# Patient Record
Sex: Female | Born: 1980 | Race: Black or African American | Hispanic: No | Marital: Single | Smoking: Never smoker
Health system: Southern US, Community
[De-identification: ages and names within clinical notes are randomized; demographics above are authoritative.]

## PROBLEM LIST (undated history)

## (undated) HISTORY — PX: WISDOM TOOTH EXTRACTION: SHX21

## (undated) HISTORY — PX: OTHER SURGICAL HISTORY: SHX169

---

## 2013-03-10 ENCOUNTER — Encounter: Payer: Self-pay | Admitting: *Deleted

## 2013-04-07 ENCOUNTER — Encounter: Payer: Self-pay | Admitting: Obstetrics & Gynecology

## 2013-06-16 ENCOUNTER — Encounter: Payer: Self-pay | Admitting: Family Medicine

## 2013-06-16 ENCOUNTER — Ambulatory Visit (INDEPENDENT_AMBULATORY_CARE_PROVIDER_SITE_OTHER): Payer: Medicaid Other | Admitting: Family Medicine

## 2013-06-16 ENCOUNTER — Other Ambulatory Visit (HOSPITAL_COMMUNITY)
Admission: RE | Admit: 2013-06-16 | Discharge: 2013-06-16 | Disposition: A | Discharge status: Home / Self Care | Payer: Medicaid Other | Source: Ambulatory Visit | Attending: Family Medicine | Admitting: Family Medicine

## 2013-06-16 VITALS — BP 100/67 | Temp 97.1°F | Wt 211.7 lb

## 2013-06-16 DIAGNOSIS — Z3482 Encounter for supervision of other normal pregnancy, second trimester: Secondary | ICD-10-CM

## 2013-06-16 DIAGNOSIS — Z3480 Encounter for supervision of other normal pregnancy, unspecified trimester: Secondary | ICD-10-CM | POA: Insufficient documentation

## 2013-06-16 DIAGNOSIS — Z01419 Encounter for gynecological examination (general) (routine) without abnormal findings: Secondary | ICD-10-CM | POA: Insufficient documentation

## 2013-06-16 DIAGNOSIS — Z3490 Encounter for supervision of normal pregnancy, unspecified, unspecified trimester: Secondary | ICD-10-CM

## 2013-06-16 DIAGNOSIS — Z113 Encounter for screening for infections with a predominantly sexual mode of transmission: Secondary | ICD-10-CM | POA: Insufficient documentation

## 2013-06-16 DIAGNOSIS — Z1151 Encounter for screening for human papillomavirus (HPV): Secondary | ICD-10-CM | POA: Insufficient documentation

## 2013-06-16 LAB — POCT URINALYSIS DIP (DEVICE)
Glucose, UA: NEGATIVE mg/dL
Ketones, ur: NEGATIVE mg/dL
Protein, ur: NEGATIVE mg/dL
Specific Gravity, Urine: 1.02 (ref 1.005–1.030)
Urobilinogen, UA: 0.2 mg/dL (ref 0.0–1.0)

## 2013-06-16 NOTE — Patient Instructions (Signed)
Second Trimester of Pregnancy The second trimester is from week 13 through week 28, months 4 through 6. The second trimester is often a time when you feel your best. Your body has also adjusted to being pregnant, and you begin to feel better physically. Usually, morning sickness has lessened or quit completely, you may have more energy, and you may have an increase in appetite. The second trimester is also a time when the fetus is growing rapidly. At the end of the sixth month, the fetus is about 9 inches long and weighs about 1 pounds. You will likely begin to feel the baby move (quickening) between 18 and 20 weeks of the pregnancy. BODY CHANGES Your body goes through many changes during pregnancy. The changes vary from woman to woman.   Your weight will continue to increase. You will notice your lower abdomen bulging out.  You may begin to get stretch marks on your hips, abdomen, and breasts.  You may develop headaches that can be relieved by medicines approved by your caregiver.  You may urinate more often because the fetus is pressing on your bladder.  You may develop or continue to have heartburn as a result of your pregnancy.  You may develop constipation because certain hormones are causing the muscles that push waste through your intestines to slow down.  You may develop hemorrhoids or swollen, bulging veins (varicose veins).  You may have back pain because of the weight gain and pregnancy hormones relaxing your joints between the bones in your pelvis and as a result of a shift in weight and the muscles that support your balance.  Your breasts will continue to grow and be tender.  Your gums may bleed and may be sensitive to brushing and flossing.  Dark spots or blotches (chloasma, mask of pregnancy) may develop on your face. This will likely fade after the baby is born.  A dark line from your belly button to the pubic area (linea nigra) may appear. This will likely fade after the  baby is born. WHAT TO EXPECT AT YOUR PRENATAL VISITS During a routine prenatal visit:  You will be weighed to make sure you and the fetus are growing normally.  Your blood pressure will be taken.  Your abdomen will be measured to track your baby's growth.  The fetal heartbeat will be listened to.  Any test results from the previous visit will be discussed. Your caregiver may ask you:  How you are feeling.  If you are feeling the baby move.  If you have had any abnormal symptoms, such as leaking fluid, bleeding, severe headaches, or abdominal cramping.  If you have any questions. Other tests that may be performed during your second trimester include:  Blood tests that check for:  Low iron levels (anemia).  Gestational diabetes (between 24 and 28 weeks).  Rh antibodies.  Urine tests to check for infections, diabetes, or protein in the urine.  An ultrasound to confirm the proper growth and development of the baby.  An amniocentesis to check for possible genetic problems.  Fetal screens for spina bifida and Down syndrome. HOME CARE INSTRUCTIONS   Avoid all smoking, herbs, alcohol, and unprescribed drugs. These chemicals affect the formation and growth of the baby.  Follow your caregiver's instructions regarding medicine use. There are medicines that are either safe or unsafe to take during pregnancy.  Exercise only as directed by your caregiver. Experiencing uterine cramps is a good sign to stop exercising.  Continue to eat regular,   healthy meals.  Wear a good support bra for breast tenderness.  Do not use hot tubs, steam rooms, or saunas.  Wear your seat belt at all times when driving.  Avoid raw meat, uncooked cheese, cat litter boxes, and soil used by cats. These carry germs that can cause birth defects in the baby.  Take your prenatal vitamins.  Try taking a stool softener (if your caregiver approves) if you develop constipation. Eat more high-fiber foods,  such as fresh vegetables or fruit and whole grains. Drink plenty of fluids to keep your urine clear or pale yellow.  Take warm sitz baths to soothe any pain or discomfort caused by hemorrhoids. Use hemorrhoid cream if your caregiver approves.  If you develop varicose veins, wear support hose. Elevate your feet for 15 minutes, 3 4 times a day. Limit salt in your diet.  Avoid heavy lifting, wear low heel shoes, and practice good posture.  Rest with your legs elevated if you have leg cramps or low back pain.  Visit your dentist if you have not gone yet during your pregnancy. Use a soft toothbrush to brush your teeth and be gentle when you floss.  A sexual relationship may be continued unless your caregiver directs you otherwise.  Continue to go to all your prenatal visits as directed by your caregiver. SEEK MEDICAL CARE IF:   You have dizziness.  You have mild pelvic cramps, pelvic pressure, or nagging pain in the abdominal area.  You have persistent nausea, vomiting, or diarrhea.  You have a bad smelling vaginal discharge.  You have pain with urination. SEEK IMMEDIATE MEDICAL CARE IF:   You have a fever.  You are leaking fluid from your vagina.  You have spotting or bleeding from your vagina.  You have severe abdominal cramping or pain.  You have rapid weight gain or loss.  You have shortness of breath with chest pain.  You notice sudden or extreme swelling of your face, hands, ankles, feet, or legs.  You have not felt your baby move in over an hour.  You have severe headaches that do not go away with medicine.  You have vision changes. Document Released: 07/10/2001 Document Revised: 03/18/2013 Document Reviewed: 09/16/2012 [employer] Patient Information 2014 [employer].  

## 2013-06-16 NOTE — Addendum Note (Signed)
Addended by: Candelaria Stagers E on: 06/16/2013 04:55 PM   Modules accepted: Orders

## 2013-06-16 NOTE — Progress Notes (Signed)
Subjective:    Misty Cunningham is being seen today for her first obstetrical visit. Was referred from the general medical clinic for a positive pregnancy test on 02/27/13. Sure LMP of 01/10/13.   This is a planned pregnancy. She is at [redacted]w[redacted]d gestation. Her obstetrical history is significant for no risk factors. Relationship with FOB: significant other, living together. Patient does intend to breast feed. Pregnancy history fully reviewed.  Having lots of FM. No ctx, lof, vb. No fevers, chills, nausea, vomiting.   Menstrual History: OB History   Grav Para Term Preterm Abortions TAB SAB Ect Mult Living   3 2 2       2     G1- girl, NSVD, 7lb 8oz G2- boy, NSVD, induced for postdates, 8lb4oz  G3- current    Patient's last menstrual period was 01/10/2013.   History reviewed. No pertinent past medical history.  History reviewed. No pertinent family history.  History   Social History  . Marital Status: Married    Spouse Name: N/A    Number of Children: N/A  . Years of Education: N/A   Occupational History  . Not on file.   Social History Main Topics  . Smoking status: Never Smoker   . Smokeless tobacco: Not on file  . Alcohol Use: No  . Drug Use: No  . Sexual Activity: Yes    Birth Control/ Protection: None   Other Topics Concern  . Not on file   Social History Narrative  . No narrative on file    No current outpatient prescriptions on file prior to visit.   No current facility-administered medications on file prior to visit.   Review of Systems Pertinent items are noted in HPI.    Objective:     BP 100/67  Temp(Src) 97.1 F (36.2 C)  Wt 211 lb 11.2 oz (96.026 kg)  LMP 01/10/2013 GENERAL: Well-developed, well-nourished female in no acute distress.  HEENT: Normocephalic, atraumatic. Sclerae anicteric.  NECK: Supple. Normal thyroid.  LUNGS: Clear to auscultation bilaterally.  HEART: Regular rate and rhythm. BREASTS: Symmetric in size- large and pendulous. No masses,  skin changes, nipple drainage, or lymphadenopathy. ABDOMEN: Soft, nontender, nondistended.  PELVIC: Normal external female genitalia. Vagina is pink and rugated.  Normal discharge. Normal cervix contour. Pap smear obtained. Uterus  Size=dates. No adnexal mass or tenderness.  EXTREMITIES: No cyanosis, clubbing, or edema, 2+ distal pulses.       Assessment:    Pregnancy at 22 and 3/7 weeks    Plan:    Initial labs drawn. Prenatal vitamins ordered Problem list reviewed and updated. Pt prefers midwife only providers both in clinic and at delivery   Role of ultrasound in pregnancy discussed; fetal survey: requested.  Follow up in 4 weeks. 50% of 30 min visit spent on counseling and coordination of care.

## 2013-06-16 NOTE — Progress Notes (Signed)
P-75 

## 2013-06-17 LAB — OBSTETRIC PANEL
Basophils Absolute: 0 10*3/uL (ref 0.0–0.1)
Hepatitis B Surface Ag: NEGATIVE
Lymphocytes Relative: 15 % (ref 12–46)
Lymphs Abs: 1.3 10*3/uL (ref 0.7–4.0)
MCV: 85.6 fL (ref 78.0–100.0)
Neutro Abs: 7 10*3/uL (ref 1.7–7.7)
Platelets: 219 10*3/uL (ref 150–400)
RBC: 3.47 MIL/uL — ABNORMAL LOW (ref 3.87–5.11)
RDW: 14.1 % (ref 11.5–15.5)
Rubella: 23.9 Index — ABNORMAL HIGH (ref <0.90)
WBC: 9.1 10*3/uL (ref 4.0–10.5)

## 2013-06-17 LAB — PRESCRIPTION MONITORING PROFILE (19 PANEL)
Barbiturate Screen, Urine: NEGATIVE ng/mL
Benzodiazepine Screen, Urine: NEGATIVE ng/mL
Buprenorphine, Urine: NEGATIVE ng/mL
Cannabinoid Scrn, Ur: NEGATIVE ng/mL
Cocaine Metabolites: NEGATIVE ng/mL
Fentanyl, Ur: NEGATIVE ng/mL
Methaqualone: NEGATIVE ng/mL
Opiate Screen, Urine: NEGATIVE ng/mL
Tramadol Scrn, Ur: NEGATIVE ng/mL
Zolpidem, Urine: NEGATIVE ng/mL
pH, Initial: 5.8 pH (ref 4.5–8.9)

## 2013-06-17 LAB — ALCOHOL METABOLITE (ETG), URINE: Ethyl Glucuronide (EtG): NEGATIVE ng/mL

## 2013-06-18 LAB — CULTURE, OB URINE: Colony Count: 35000

## 2013-06-19 ENCOUNTER — Encounter: Payer: Self-pay | Admitting: Family Medicine

## 2013-06-19 DIAGNOSIS — D573 Sickle-cell trait: Secondary | ICD-10-CM | POA: Insufficient documentation

## 2013-06-19 LAB — HEMOGLOBINOPATHY EVALUATION
Hemoglobin Other: 0 %
Hgb F Quant: 0 % (ref 0.0–2.0)
Hgb S Quant: 38.6 % — ABNORMAL HIGH

## 2013-06-22 ENCOUNTER — Telehealth: Payer: Self-pay | Admitting: *Deleted

## 2013-06-22 NOTE — Telephone Encounter (Signed)
Message copied by Gerome Apley on Mon Jun 22, 2013  9:39 AM ------      Message from: [place]      Created: Fri Jun 19, 2013  9:59 PM       The pt probably already knows this, but can you please call her and let her know that her lab work shows that she has sickle cell trait? Thanks! ------

## 2013-06-22 NOTE — Telephone Encounter (Signed)
Called [facility] and left a message we are calling with some results. Please call the office during office hours.

## 2013-06-23 ENCOUNTER — Ambulatory Visit (HOSPITAL_COMMUNITY): Admission: RE | Admit: 2013-06-23 | Payer: Medicaid Other | Source: Ambulatory Visit

## 2013-06-23 NOTE — Telephone Encounter (Signed)
Called patient and left message to call us back.

## 2013-06-24 ENCOUNTER — Other Ambulatory Visit: Payer: Self-pay | Admitting: Family Medicine

## 2013-06-24 DIAGNOSIS — Z09 Encounter for follow-up examination after completed treatment for conditions other than malignant neoplasm: Secondary | ICD-10-CM

## 2013-06-24 NOTE — Telephone Encounter (Signed)
Called Misty Cunningham and notified her of sickle cell trait- she states she did not know she had sickle cell trait , but that she knew her son did.  Advised her at next ob appt the provider could answer any questions she has, reviewed date of next appt. With her.

## 2013-07-02 ENCOUNTER — Encounter: Payer: Self-pay | Admitting: Obstetrics & Gynecology

## 2013-07-07 ENCOUNTER — Ambulatory Visit (HOSPITAL_COMMUNITY)
Admission: RE | Admit: 2013-07-07 | Discharge: 2013-07-07 | Disposition: A | Discharge status: Home / Self Care | Payer: Medicaid Other | Source: Ambulatory Visit | Attending: Family Medicine | Admitting: Family Medicine

## 2013-07-07 DIAGNOSIS — Z09 Encounter for follow-up examination after completed treatment for conditions other than malignant neoplasm: Secondary | ICD-10-CM

## 2013-07-07 DIAGNOSIS — Z3689 Encounter for other specified antenatal screening: Secondary | ICD-10-CM | POA: Insufficient documentation

## 2013-07-08 ENCOUNTER — Encounter: Payer: Self-pay | Admitting: Family Medicine

## 2013-07-15 ENCOUNTER — Encounter: Payer: Self-pay | Admitting: Advanced Practice Midwife

## 2013-07-15 ENCOUNTER — Ambulatory Visit (INDEPENDENT_AMBULATORY_CARE_PROVIDER_SITE_OTHER): Payer: Medicaid Other | Admitting: Advanced Practice Midwife

## 2013-07-15 VITALS — BP 113/73 | Wt 217.0 lb

## 2013-07-15 DIAGNOSIS — Z3480 Encounter for supervision of other normal pregnancy, unspecified trimester: Secondary | ICD-10-CM

## 2013-07-15 DIAGNOSIS — D573 Sickle-cell trait: Secondary | ICD-10-CM

## 2013-07-15 LAB — POCT URINALYSIS DIP (DEVICE)
Bilirubin Urine: NEGATIVE
Ketones, ur: NEGATIVE mg/dL
Nitrite: NEGATIVE
pH: 6.5 (ref 5.0–8.0)

## 2013-07-15 NOTE — Progress Notes (Signed)
Pulse: 89 Pt will do her 28 week labs at next visit.

## 2013-07-15 NOTE — Progress Notes (Signed)
Doing well.  Good fetal movement, denies vaginal bleeding, LOF, regular contractions.   Some occasional cramping while at work, bilateral sharp pains, pelvic pain with movement.  Discussed round ligament and pelvic pain, recommend rest, ice, heat, increased fluid intake.  Was taking vegetarian multivitamin, 7 pills/day, but causes nausea.  Recommend prenatal vitamin, or at least iron supplement daily.  Labs/glucose testing in 2 weeks at next visit.

## 2013-07-30 NOTE — L&D Delivery Note (Signed)
Delivery Note At 9:43 PM a viable female was delivered via Vaginal, Spontaneous Delivery (Presentation:occiput; anterior ).  APGAR: 9, 9; weight pending.   Placenta status: Intact, Spontaneous.  Cord: 3 vessels with the following complications: None.  Cord pH: not sent. IM pitocin (10 units) administered after delivery and fundus massaged until firm and hemodynamically stable.  Grandmother, FOB, and 2 children present for delivery. Infant put directly on mom's chest after delivery.  Parents bonding with infant.   Anesthesia: None  Episiotomy: None Lacerations: None Suture Repair: N/A Est. Blood Loss (mL): 400  Mom to postpartum.  Baby to Couplet care / Skin to Skin.  Misty Cunningham, Misty Cunningham 10/23/2013, 10:17 PM  I have seen and examined this patient and I agree with the above. Misty Cunningham 10:30 PM 10/23/2013

## 2013-08-12 ENCOUNTER — Ambulatory Visit (INDEPENDENT_AMBULATORY_CARE_PROVIDER_SITE_OTHER): Payer: Medicaid Other | Admitting: Family

## 2013-08-12 VITALS — BP 110/65 | Wt 220.8 lb

## 2013-08-12 DIAGNOSIS — D573 Sickle-cell trait: Secondary | ICD-10-CM

## 2013-08-12 DIAGNOSIS — Z3480 Encounter for supervision of other normal pregnancy, unspecified trimester: Secondary | ICD-10-CM

## 2013-08-12 LAB — POCT URINALYSIS DIP (DEVICE)
Bilirubin Urine: NEGATIVE
Glucose, UA: NEGATIVE mg/dL
Hgb urine dipstick: NEGATIVE
Ketones, ur: NEGATIVE mg/dL
Nitrite: NEGATIVE
Protein, ur: NEGATIVE mg/dL
Specific Gravity, Urine: 1.015 (ref 1.005–1.030)
Urobilinogen, UA: 0.2 mg/dL (ref 0.0–1.0)
pH: 7 (ref 5.0–8.0)

## 2013-08-12 NOTE — Progress Notes (Signed)
Pulse: 92

## 2013-08-12 NOTE — Progress Notes (Signed)
Reviewed ultrasound results (bilat renal pyelectasis)> rescan at 34 wks.  Report pressure in lower back, no UTI symptoms > urine culture sent.  Denies vaginal bleeding or leaking of fluid.  Preterm labor precautions given.  Unable to stay for 1 hr today.  Will return next week for lab only.

## 2013-08-14 LAB — CULTURE, OB URINE
COLONY COUNT: NO GROWTH
ORGANISM ID, BACTERIA: NO GROWTH

## 2013-08-18 ENCOUNTER — Other Ambulatory Visit: Payer: Medicaid Other

## 2013-08-18 DIAGNOSIS — Z3480 Encounter for supervision of other normal pregnancy, unspecified trimester: Secondary | ICD-10-CM

## 2013-08-18 LAB — RPR

## 2013-08-18 LAB — HIV ANTIBODY (ROUTINE TESTING W REFLEX): HIV: NONREACTIVE

## 2013-08-19 ENCOUNTER — Encounter: Payer: Self-pay | Admitting: Family

## 2013-08-19 LAB — GLUCOSE TOLERANCE, 1 HOUR (50G) W/O FASTING: Glucose, 1 Hour GTT: 140 mg/dL (ref 70–140)

## 2013-08-26 ENCOUNTER — Encounter: Payer: Medicaid Other | Admitting: Advanced Practice Midwife

## 2013-09-09 ENCOUNTER — Encounter: Payer: Self-pay | Admitting: Obstetrics and Gynecology

## 2013-09-09 ENCOUNTER — Ambulatory Visit (INDEPENDENT_AMBULATORY_CARE_PROVIDER_SITE_OTHER): Payer: Medicaid Other | Admitting: Advanced Practice Midwife

## 2013-09-09 ENCOUNTER — Encounter: Payer: Self-pay | Admitting: Advanced Practice Midwife

## 2013-09-09 VITALS — BP 113/76 | Temp 97.2°F | Wt 230.2 lb

## 2013-09-09 DIAGNOSIS — O283 Abnormal ultrasonic finding on antenatal screening of mother: Secondary | ICD-10-CM

## 2013-09-09 DIAGNOSIS — Z3480 Encounter for supervision of other normal pregnancy, unspecified trimester: Secondary | ICD-10-CM

## 2013-09-09 DIAGNOSIS — D573 Sickle-cell trait: Secondary | ICD-10-CM

## 2013-09-09 LAB — POCT URINALYSIS DIP (DEVICE)
Bilirubin Urine: NEGATIVE
Glucose, UA: NEGATIVE mg/dL
Ketones, ur: NEGATIVE mg/dL
NITRITE: NEGATIVE
PH: 6 (ref 5.0–8.0)
Protein, ur: NEGATIVE mg/dL
Specific Gravity, Urine: 1.015 (ref 1.005–1.030)
UROBILINOGEN UA: 0.2 mg/dL (ref 0.0–1.0)

## 2013-09-09 NOTE — Progress Notes (Signed)
Reviewed ultrasound results and gave further description of bilat renal pyelectasis.  Follow-up ultrasound scheduled. Reports occasional contractions. Denies VB, LOF. Reviewed labor precautions.  Vaginal exam showed no bleeding at this time. Not other complications.

## 2013-09-09 NOTE — Progress Notes (Signed)
Seen also by me.  States has had pink spotting all week. +/- contractions. Cervix softened. PTL precautions reviewed. States was never told about "liver abnormality" until recently. I reviewed chart, and B renal pyelectasis was discussed on 08/12/13 by CNM and I further explained in detail what it was and that it was considered mild. Also that in isolation, not thought to be a big concern, though we will do one more US to follow up.

## 2013-09-09 NOTE — Progress Notes (Signed)
Pulse- 89  Patient reports pelvic pain/pressure; has occasional non painful contractions; patient reports spotting off and on for past week; patient needs to schedule follow up ultrasound appt.

## 2013-09-09 NOTE — Patient Instructions (Signed)
Third Trimester of Pregnancy  The third trimester is from week 29 through week 42, months 7 through 9. The third trimester is a time when the fetus is growing rapidly. At the end of the ninth month, the fetus is about 20 inches in length and weighs 6 10 pounds.   BODY CHANGES  Your body goes through many changes during pregnancy. The changes vary from woman to woman.    Your weight will continue to increase. You can expect to gain 25 35 pounds (11 16 kg) by the end of the pregnancy.   You may begin to get stretch marks on your hips, abdomen, and breasts.   You may urinate more often because the fetus is moving lower into your pelvis and pressing on your bladder.   You may develop or continue to have heartburn as a result of your pregnancy.   You may develop constipation because certain hormones are causing the muscles that push waste through your intestines to slow down.   You may develop hemorrhoids or swollen, bulging veins (varicose veins).   You may have pelvic pain because of the weight gain and pregnancy hormones relaxing your joints between the bones in your pelvis. Back aches may result from over exertion of the muscles supporting your posture.   Your breasts will continue to grow and be tender. A yellow discharge may leak from your breasts called colostrum.   Your belly button may stick out.   You may feel short of breath because of your expanding uterus.   You may notice the fetus "dropping," or moving lower in your abdomen.   You may have a bloody mucus discharge. This usually occurs a few days to a week before labor begins.   Your cervix becomes thin and soft (effaced) near your due date.  WHAT TO EXPECT AT YOUR PRENATAL EXAMS   You will have prenatal exams every 2 weeks until week 36. Then, you will have weekly prenatal exams. During a routine prenatal visit:   You will be weighed to make sure you and the fetus are growing normally.   Your blood pressure is taken.   Your abdomen will be  measured to track your baby's growth.   The fetal heartbeat will be listened to.   Any test results from the previous visit will be discussed.   You may have a cervical check near your due date to see if you have effaced.  At around 36 weeks, your caregiver will check your cervix. At the same time, your caregiver will also perform a test on the secretions of the vaginal tissue. This test is to determine if a type of bacteria, Group B streptococcus, is present. Your caregiver will explain this further.  Your caregiver may ask you:   What your birth plan is.   How you are feeling.   If you are feeling the baby move.   If you have had any abnormal symptoms, such as leaking fluid, bleeding, severe headaches, or abdominal cramping.   If you have any questions.  Other tests or screenings that may be performed during your third trimester include:   Blood tests that check for low iron levels (anemia).   Fetal testing to check the health, activity level, and growth of the fetus. Testing is done if you have certain medical conditions or if there are problems during the pregnancy.  FALSE LABOR  You may feel small, irregular contractions that eventually go away. These are called Braxton Hicks contractions, or   false labor. Contractions may last for hours, days, or even weeks before true labor sets in. If contractions come at regular intervals, intensify, or become painful, it is best to be seen by your caregiver.   SIGNS OF LABOR    Menstrual-like cramps.   Contractions that are 5 minutes apart or less.   Contractions that start on the top of the uterus and spread down to the lower abdomen and back.   A sense of increased pelvic pressure or back pain.   A watery or bloody mucus discharge that comes from the vagina.  If you have any of these signs before the 37th week of pregnancy, call your caregiver right away. You need to go to the hospital to get checked immediately.  HOME CARE INSTRUCTIONS    Avoid all  smoking, herbs, alcohol, and unprescribed drugs. These chemicals affect the formation and growth of the baby.   Follow your caregiver's instructions regarding medicine use. There are medicines that are either safe or unsafe to take during pregnancy.   Exercise only as directed by your caregiver. Experiencing uterine cramps is a good sign to stop exercising.   Continue to eat regular, healthy meals.   Wear a good support bra for breast tenderness.   Do not use hot tubs, steam rooms, or saunas.   Wear your seat belt at all times when driving.   Avoid raw meat, uncooked cheese, cat litter boxes, and soil used by cats. These carry germs that can cause birth defects in the baby.   Take your prenatal vitamins.   Try taking a stool softener (if your caregiver approves) if you develop constipation. Eat more high-fiber foods, such as fresh vegetables or fruit and whole grains. Drink plenty of fluids to keep your urine clear or pale yellow.   Take warm sitz baths to soothe any pain or discomfort caused by hemorrhoids. Use hemorrhoid cream if your caregiver approves.   If you develop varicose veins, wear support hose. Elevate your feet for 15 minutes, 3 4 times a day. Limit salt in your diet.   Avoid heavy lifting, wear low heal shoes, and practice good posture.   Rest a lot with your legs elevated if you have leg cramps or low back pain.   Visit your dentist if you have not gone during your pregnancy. Use a soft toothbrush to brush your teeth and be gentle when you floss.   A sexual relationship may be continued unless your caregiver directs you otherwise.   Do not travel far distances unless it is absolutely necessary and only with the approval of your caregiver.   Take prenatal classes to understand, practice, and ask questions about the labor and delivery.   Make a trial run to the hospital.   Pack your hospital bag.   Prepare the baby's nursery.   Continue to go to all your prenatal visits as directed  by your caregiver.  SEEK MEDICAL CARE IF:   You are unsure if you are in labor or if your water has broken.   You have dizziness.   You have mild pelvic cramps, pelvic pressure, or nagging pain in your abdominal area.   You have persistent nausea, vomiting, or diarrhea.   You have a bad smelling vaginal discharge.   You have pain with urination.  SEEK IMMEDIATE MEDICAL CARE IF:    You have a fever.   You are leaking fluid from your vagina.   You have spotting or bleeding from your vagina.     You have severe abdominal cramping or pain.   You have rapid weight loss or gain.   You have shortness of breath with chest pain.   You notice sudden or extreme swelling of your face, hands, ankles, feet, or legs.   You have not felt your baby move in over an hour.   You have severe headaches that do not go away with medicine.   You have vision changes.  Document Released: 07/10/2001 Document Revised: 03/18/2013 Document Reviewed: 09/16/2012  [employer] Patient Information 2014 [employer].

## 2013-09-15 ENCOUNTER — Ambulatory Visit (HOSPITAL_COMMUNITY): Admission: RE | Admit: 2013-09-15 | Payer: Medicaid Other | Source: Ambulatory Visit

## 2013-09-16 ENCOUNTER — Ambulatory Visit (HOSPITAL_COMMUNITY)
Admission: RE | Admit: 2013-09-16 | Discharge: 2013-09-16 | Disposition: A | Discharge status: Home / Self Care | Payer: Medicaid Other | Source: Ambulatory Visit | Attending: Advanced Practice Midwife | Admitting: Advanced Practice Midwife

## 2013-09-16 DIAGNOSIS — O358XX0 Maternal care for other (suspected) fetal abnormality and damage, not applicable or unspecified: Secondary | ICD-10-CM | POA: Insufficient documentation

## 2013-09-16 DIAGNOSIS — O283 Abnormal ultrasonic finding on antenatal screening of mother: Secondary | ICD-10-CM

## 2013-09-16 DIAGNOSIS — Z363 Encounter for antenatal screening for malformations: Secondary | ICD-10-CM | POA: Insufficient documentation

## 2013-09-16 DIAGNOSIS — Z1389 Encounter for screening for other disorder: Secondary | ICD-10-CM | POA: Insufficient documentation

## 2013-09-20 ENCOUNTER — Encounter (HOSPITAL_COMMUNITY): Payer: Self-pay | Admitting: Advanced Practice Midwife

## 2013-09-23 ENCOUNTER — Encounter: Payer: Medicaid Other | Admitting: Obstetrics and Gynecology

## 2013-10-07 ENCOUNTER — Encounter: Payer: Medicaid Other | Admitting: Obstetrics and Gynecology

## 2013-10-07 ENCOUNTER — Telehealth: Payer: Self-pay | Admitting: *Deleted

## 2013-10-07 NOTE — Telephone Encounter (Signed)
Patient called and stated that she had to cancel her appt for today. She was rescheduled for 10/21/13. She will be 5049w2d on the 25th. She wanted to know if she is going to be missing anything because of this. The last time she was seen she was 5919w4d. Called patient and left message for her to call back. Need to get her back in sooner than the 25th as she needs cultures.

## 2013-10-08 ENCOUNTER — Encounter: Payer: Self-pay | Admitting: Advanced Practice Midwife

## 2013-10-08 ENCOUNTER — Encounter: Payer: Medicaid Other | Admitting: Advanced Practice Midwife

## 2013-10-08 NOTE — Telephone Encounter (Signed)
Called patient back and informed her that I found an open slot for her for today at 1:40. Pt agreeable to this and will come to clinic.

## 2013-10-11 ENCOUNTER — Encounter: Payer: Self-pay | Admitting: *Deleted

## 2013-10-21 ENCOUNTER — Ambulatory Visit (INDEPENDENT_AMBULATORY_CARE_PROVIDER_SITE_OTHER): Payer: Medicaid Other | Admitting: Family Medicine

## 2013-10-21 VITALS — BP 128/85 | Temp 97.7°F | Wt 239.7 lb

## 2013-10-21 DIAGNOSIS — Z3480 Encounter for supervision of other normal pregnancy, unspecified trimester: Secondary | ICD-10-CM

## 2013-10-21 DIAGNOSIS — O480 Post-term pregnancy: Secondary | ICD-10-CM

## 2013-10-21 LAB — US OB FOLLOW UP

## 2013-10-21 LAB — POCT URINALYSIS DIP (DEVICE)
Bilirubin Urine: NEGATIVE
Glucose, UA: NEGATIVE mg/dL
HGB URINE DIPSTICK: NEGATIVE
Ketones, ur: NEGATIVE mg/dL
NITRITE: NEGATIVE
PH: 5.5 (ref 5.0–8.0)
PROTEIN: 30 mg/dL — AB
Specific Gravity, Urine: 1.02 (ref 1.005–1.030)
Urobilinogen, UA: 0.2 mg/dL (ref 0.0–1.0)

## 2013-10-21 LAB — OB RESULTS CONSOLE GBS: STREP GROUP B AG: NEGATIVE

## 2013-10-21 LAB — OB RESULTS CONSOLE GC/CHLAMYDIA
CHLAMYDIA, DNA PROBE: NEGATIVE
Gonorrhea: NEGATIVE

## 2013-10-21 NOTE — Progress Notes (Signed)
Pt declines IOL @ 41 wks- will continue twice weekly fetal testing.

## 2013-10-21 NOTE — Addendum Note (Signed)
Addended by: Faythe CasaBELLAMY, Langley Ingalls M on: 10/21/2013 02:47 PM   Modules accepted: Orders

## 2013-10-21 NOTE — Progress Notes (Signed)
P= 94 C/o of intermittent lower abdominal/pelvic pressure and braxton hicks.  

## 2013-10-21 NOTE — Progress Notes (Signed)
S: 33 yo G3P2002 @ 311w4d here for ROBV - NST and AFI done today due to postdates.   A/P - NST- cat I tracing. Baseline 145, mod var, + accels, no decels - one ctx. - labor precautions discussed - pt still refusing induction at 41 weeks. Discussed exponential increase in risk of still birth after 41 weeks and pt understands risk  - f/u for testing on Friday and for visit next week.

## 2013-10-22 LAB — GC/CHLAMYDIA PROBE AMP
CT Probe RNA: NEGATIVE
GC PROBE AMP APTIMA: NEGATIVE

## 2013-10-23 ENCOUNTER — Inpatient Hospital Stay (HOSPITAL_COMMUNITY)
Admission: AD | Admit: 2013-10-23 | Discharge: 2013-10-25 | DRG: 775 | Disposition: A | Discharge status: Home / Self Care | Payer: Medicaid Other | Source: Ambulatory Visit | Attending: Obstetrics & Gynecology | Admitting: Obstetrics & Gynecology

## 2013-10-23 ENCOUNTER — Other Ambulatory Visit: Payer: Medicaid Other

## 2013-10-23 ENCOUNTER — Encounter (HOSPITAL_COMMUNITY): Payer: Self-pay | Admitting: *Deleted

## 2013-10-23 ENCOUNTER — Encounter: Payer: Self-pay | Admitting: Family Medicine

## 2013-10-23 ENCOUNTER — Inpatient Hospital Stay (HOSPITAL_COMMUNITY)
Admission: AD | Admit: 2013-10-23 | Discharge: 2013-10-23 | Disposition: A | Discharge status: Home / Self Care | Payer: Medicaid Other | Source: Ambulatory Visit | Attending: Obstetrics & Gynecology | Admitting: Obstetrics & Gynecology

## 2013-10-23 DIAGNOSIS — R0602 Shortness of breath: Secondary | ICD-10-CM | POA: Diagnosis not present

## 2013-10-23 DIAGNOSIS — R0789 Other chest pain: Secondary | ICD-10-CM

## 2013-10-23 DIAGNOSIS — O99893 Other specified diseases and conditions complicating puerperium: Secondary | ICD-10-CM

## 2013-10-23 DIAGNOSIS — O9989 Other specified diseases and conditions complicating pregnancy, childbirth and the puerperium: Principal | ICD-10-CM

## 2013-10-23 DIAGNOSIS — IMO0001 Reserved for inherently not codable concepts without codable children: Secondary | ICD-10-CM

## 2013-10-23 DIAGNOSIS — O479 False labor, unspecified: Secondary | ICD-10-CM

## 2013-10-23 LAB — CBC
HCT: 28 % — ABNORMAL LOW (ref 36.0–46.0)
Hemoglobin: 9.7 g/dL — ABNORMAL LOW (ref 12.0–15.0)
MCH: 28.1 pg (ref 26.0–34.0)
MCHC: 34.6 g/dL (ref 30.0–36.0)
MCV: 81.2 fL (ref 78.0–100.0)
Platelets: 175 10*3/uL (ref 150–400)
RBC: 3.45 MIL/uL — ABNORMAL LOW (ref 3.87–5.11)
RDW: 15.7 % — AB (ref 11.5–15.5)
WBC: 11.4 10*3/uL — AB (ref 4.0–10.5)

## 2013-10-23 LAB — CULTURE, STREPTOCOCCUS GRP B W/SUSCEPT

## 2013-10-23 LAB — GLUCOSE, CAPILLARY: GLUCOSE-CAPILLARY: 106 mg/dL — AB (ref 70–99)

## 2013-10-23 MED ORDER — MISOPROSTOL 200 MCG PO TABS
ORAL_TABLET | ORAL | Status: AC
  Filled 2013-10-23: qty 5

## 2013-10-23 MED ORDER — LIDOCAINE HCL (PF) 1 % IJ SOLN
30.0000 mL | INTRAMUSCULAR | Status: DC | PRN
  Filled 2013-10-23: qty 30

## 2013-10-23 MED ORDER — OXYTOCIN 10 UNIT/ML IJ SOLN
10.0000 [IU] | Freq: Once | INTRAMUSCULAR | Status: AC
  Administered 2013-10-23: 10 [IU] via INTRAMUSCULAR

## 2013-10-23 MED ORDER — OXYCODONE-ACETAMINOPHEN 5-325 MG PO TABS: 1.0000 | ORAL_TABLET | ORAL | Status: DC | PRN

## 2013-10-23 MED ORDER — ONDANSETRON HCL 4 MG/2ML IJ SOLN: 4.0000 mg | Freq: Four times a day (QID) | INTRAMUSCULAR | Status: DC | PRN

## 2013-10-23 MED ORDER — LACTATED RINGERS IV SOLN: INTRAVENOUS | Status: DC

## 2013-10-23 MED ORDER — ACETAMINOPHEN 325 MG PO TABS: 650.0000 mg | ORAL_TABLET | ORAL | Status: DC | PRN

## 2013-10-23 MED ORDER — OXYTOCIN BOLUS FROM INFUSION: 500.0000 mL | INTRAVENOUS | Status: DC

## 2013-10-23 MED ORDER — OXYTOCIN 40 UNITS IN LACTATED RINGERS INFUSION - SIMPLE MED: 62.5000 mL/h | INTRAVENOUS | Status: DC

## 2013-10-23 MED ORDER — CITRIC ACID-SODIUM CITRATE 334-500 MG/5ML PO SOLN: 30.0000 mL | ORAL | Status: DC | PRN

## 2013-10-23 MED ORDER — LACTATED RINGERS IV SOLN: 500.0000 mL | INTRAVENOUS | Status: DC | PRN

## 2013-10-23 MED ORDER — IBUPROFEN 600 MG PO TABS: 600.0000 mg | ORAL_TABLET | Freq: Four times a day (QID) | ORAL | Status: DC | PRN

## 2013-10-23 NOTE — Progress Notes (Signed)
Pt came in uncomfortable. To Rm #[id number]. [facility] stretcher with Rudene ReKristen Weiss RN

## 2013-10-23 NOTE — MAU Note (Signed)
Pt states U/C's started last night.  Today they are between 8-15 minutes apart.  Pt has appt scheduled for 1100 today but decided to come her first.  She states the contractions hurt badly.  No vaginal bleeding or ROM.  Good fetal movement.

## 2013-10-23 NOTE — H&P (Signed)
Misty Cunningham is a 33 y.o. female at 140w6d presenting for active labor.  Maternal Medical History:  Reason for admission: Contractions and vaginal bleeding.   Contractions: Onset was 6-12 hours ago.   Frequency: irregular.   Duration is approximately 90 seconds.   Perceived severity is strong.    Fetal activity: Perceived fetal activity is normal.   Last perceived fetal movement was within the past hour.      OB History   Grav Para Term Preterm Abortions TAB SAB Ect Mult Living   3 2 2       2      History reviewed. No pertinent past medical history. Past Surgical History  Procedure Laterality Date  . Wisdome teeth    . Wisdom tooth extraction     Family History: family history is not on file. Social History:  reports that she has never smoked. She does not have any smokeless tobacco history on file. She reports that she does not drink alcohol or use illicit drugs.   Prenatal Transfer Tool  Maternal Diabetes: No Genetic Screening: Normal Maternal Ultrasounds/Referrals: Normal Fetal Ultrasounds or other Referrals:  None Maternal Substance Abuse:  No Significant Maternal Medications:  None Significant Maternal Lab Results:  Lab values include: Group B Strep negative Other Comments:  None  Review of Systems  Constitutional: Negative.   HENT: Negative.   Eyes: Negative.   Respiratory: Negative.   Cardiovascular: Negative.   Gastrointestinal: Positive for abdominal pain.  Genitourinary: Negative.        Bloody show  Musculoskeletal: Negative.   Skin: Negative.   Neurological: Negative.   Endo/Heme/Allergies: Negative.   Psychiatric/Behavioral: Negative.     Dilation: 10 Effacement (%): 100 Station: +1 Exam by:: tina braimah, cnm student Blood pressure 134/94, pulse 99, resp. rate 20, height 5\' 10"  (1.778 m), weight 132.904 kg (293 lb), last menstrual period 01/10/2013. Maternal Exam:  Uterine Assessment: Contraction strength is firm.  Contraction frequency is  regular.   Abdomen: Patient reports no abdominal tenderness. Fetal presentation: vertex  Introitus: Normal vulva. Normal vagina.  Pelvis: adequate for delivery.      Fetal Exam Fetal Monitor Review: Mode: ultrasound.   Baseline rate: 140.  Variability: moderate (6-25 bpm).   Pattern: accelerations present and no decelerations.    Fetal State Assessment: Category I - tracings are normal.     Physical Exam  Prenatal labs: ABO, Rh: A/POS/-- (11/18 1542) Antibody: NEG (11/18 1542) Rubella: 23.90 (11/18 1542) RPR: NON REAC (01/20 1055)  HBsAg: NEGATIVE (11/18 1542)  HIV: NON REACTIVE (01/20 1055)  GBS: Negative (03/25 0000)   Assessment/Plan: Patient to be sent to L&D immediately due to imminent delivery   [name], Tina CNM Student 10/23/2013, 9:19 PM     Attestation of Attending Supervision of CNMStudent: Evaluation and management procedures were performed by the CNM Student under my supervision.  I have reviewed the the student's note and chart, and I agree with management and plan.  Jaynie CollinsUGONNA  Mohanad Carsten, MD, FACOG Attending Obstetrician & Gynecologist Faculty Practice, [facility]

## 2013-10-23 NOTE — MAU Provider Note (Signed)
Chief Complaint:  Labor Eval   None     HPI: Misty LeaderJessica Tisdel is a 33 y.o. G3P2002 at 4140w6dwho presents to maternity admissions reporting contractions x1 week, becoming stronger early this morning, going from 30 minutes apart to 8 minutes apart over several hours.  She reports she has to breath through some contractions but not others.  She reports good fetal movement, denies LOF, vaginal bleeding, vaginal itching/burning, urinary symptoms, h/a, dizziness, n/v, or fever/chills.    Past Medical History: History reviewed. No pertinent past medical history.  Past obstetric history: OB History  Gravida Para Term Preterm AB SAB TAB Ectopic Multiple Living  3 2 2       2     # Outcome Date GA Lbr Len/2nd Weight Sex Delivery Anes PTL Lv  3 CUR           2 TRM 09/01/07 [redacted]w[redacted]d  3.742 kg (8 lb 4 oz) M SVD EPI N Y  1 TRM 03/09/02 2561w0d  3.402 kg (7 lb 8 oz) F SVD EPI N Y      Past Surgical History: History reviewed. No pertinent past surgical history.  Family History: History reviewed. No pertinent family history.  Social History: History  Substance Use Topics  . Smoking status: Never Smoker   . Smokeless tobacco: Not on file  . Alcohol Use: No    Allergies: No Known Allergies  Meds:  No prescriptions prior to admission    ROS: Pertinent findings in history of present illness.  Physical Exam  Blood pressure 104/76, pulse 72, temperature 98.2 F (36.8 C), temperature source Oral, resp. rate 20, last menstrual period 01/10/2013, SpO2 100.00%. GENERAL: Well-developed, well-nourished female in no acute distress.  HEENT: normocephalic HEART: normal rate RESP: normal effort ABDOMEN: Soft, non-tender, gravid appropriate for gestational age EXTREMITIES: Nontender, no edema NEURO: alert and oriented  Results for orders placed during the hospital encounter of 10/23/13 (from the past 24 hour(s))  GLUCOSE, CAPILLARY     Status: Abnormal   Collection Time    10/23/13  3:31 PM   Result Value Ref Range   Glucose-Capillary 106 (*) 70 - 99 mg/dL   Initial cervical exam: 1.5 cm/50/-2  Posterior, vertex Exam by Thressa ShellerLori Paschal, RN.    Pt given choice to go home and return if contractions stronger/closer together or stay in MAU for recheck of cervix in 1 hour.  Pt prefers to stay for reevaluation of labor.  Pt walked and reports no change in contractions intensity/duration.  Cervix rechecked in 2 hours from prior exam: 3.5/50/-2,  Anterior, vertex Exam by: Thressa ShellerLori Paschal, RN  Discussed cervical change with pt despite no change in contractions per pt.  Plan to recheck in 1 hour.  Cervix unchanged.   3.5/50/-3,  Posterior, vertex Exam by: Sharen CounterLisa Leftwich-Kirby, CNM  FHT:  Baseline 135 , moderate variability, accelerations present, no decelerations Contractions: 3-8, mild to moderate to palpation    Assessment: 1. Threatened labor at term     Plan: D/C home with labor precautions      Follow-up Information   [facility] (As scheduled.  Return to MAU as needed.)    Specialty:  Obstetrics and Gynecology   Contact information:   [address] [place] [id number] [phone]       Medication List    ASK your doctor about these medications       multivitamin-prenatal 27-0.8 MG Tabs tablet  Take 1 tablet by mouth daily at 12  noon.        Sharen Counter Certified Nurse-Midwife 10/23/2013 7:50 PM

## 2013-10-23 NOTE — Progress Notes (Signed)
Patient ID: Misty LeaderJessica Cunningham, female   DOB: [birth year], 33 y.o.   MRN: [id number]  [name] is a 33 yo, now G3P3003, who delivered a viable baby girl via NSVD.  She presented to the MAU at 9cm dilation and quickly progressed to 2nd stage.  The patient did not have IV access for delivery, which was uncomplicated. The patient was hemodynamically stable following the delivery. It is not necessary for the patient to have IV access on the postpartum unit.  [name], Leyton Magoon SNM

## 2013-10-24 LAB — ABO/RH: ABO/RH(D): A POS

## 2013-10-24 LAB — TYPE AND SCREEN
ABO/RH(D): A POS
ANTIBODY SCREEN: NEGATIVE

## 2013-10-24 LAB — RPR: RPR Ser Ql: NONREACTIVE

## 2013-10-24 MED ORDER — TETANUS-DIPHTH-ACELL PERTUSSIS 5-2.5-18.5 LF-MCG/0.5 IM SUSP: 0.5000 mL | Freq: Once | INTRAMUSCULAR | Status: DC

## 2013-10-24 MED ORDER — ONDANSETRON HCL 4 MG/2ML IJ SOLN: 4.0000 mg | INTRAMUSCULAR | Status: DC | PRN

## 2013-10-24 MED ORDER — PRENATAL MULTIVITAMIN CH
1.0000 | ORAL_TABLET | Freq: Every day | ORAL | Status: DC
Start: 2013-10-24 — End: 2013-10-25
  Filled 2013-10-24: qty 1

## 2013-10-24 MED ORDER — SENNOSIDES-DOCUSATE SODIUM 8.6-50 MG PO TABS: 2.0000 | ORAL_TABLET | ORAL | Status: DC

## 2013-10-24 MED ORDER — WITCH HAZEL-GLYCERIN EX PADS: 1.0000 "application " | MEDICATED_PAD | CUTANEOUS | Status: DC | PRN

## 2013-10-24 MED ORDER — ONDANSETRON HCL 4 MG PO TABS: 4.0000 mg | ORAL_TABLET | ORAL | Status: DC | PRN

## 2013-10-24 MED ORDER — DIBUCAINE 1 % RE OINT: 1.0000 "application " | TOPICAL_OINTMENT | RECTAL | Status: DC | PRN

## 2013-10-24 MED ORDER — OXYTOCIN 10 UNIT/ML IJ SOLN
INTRAMUSCULAR | Status: AC
  Filled 2013-10-24: qty 1

## 2013-10-24 MED ORDER — ZOLPIDEM TARTRATE 5 MG PO TABS: 5.0000 mg | ORAL_TABLET | Freq: Every evening | ORAL | Status: DC | PRN

## 2013-10-24 MED ORDER — SIMETHICONE 80 MG PO CHEW: 80.0000 mg | CHEWABLE_TABLET | ORAL | Status: DC | PRN

## 2013-10-24 MED ORDER — BENZOCAINE-MENTHOL 20-0.5 % EX AERO: 1.0000 "application " | INHALATION_SPRAY | CUTANEOUS | Status: DC | PRN

## 2013-10-24 MED ORDER — DIPHENHYDRAMINE HCL 25 MG PO CAPS: 25.0000 mg | ORAL_CAPSULE | Freq: Four times a day (QID) | ORAL | Status: DC | PRN

## 2013-10-24 MED ORDER — LANOLIN HYDROUS EX OINT: TOPICAL_OINTMENT | CUTANEOUS | Status: DC | PRN

## 2013-10-24 MED ORDER — OXYCODONE-ACETAMINOPHEN 5-325 MG PO TABS: 1.0000 | ORAL_TABLET | ORAL | Status: DC | PRN

## 2013-10-24 MED ORDER — IBUPROFEN 600 MG PO TABS
600.0000 mg | ORAL_TABLET | Freq: Four times a day (QID) | ORAL | Status: DC
  Filled 2013-10-24 (×2): qty 1

## 2013-10-24 NOTE — Progress Notes (Signed)
Post Partum Day #1 Subjective: no complaints, up ad lib, voiding, tolerating PO and + flatus  Objective: Blood pressure 120/76, pulse 69, temperature 98.9 F (37.2 C), temperature source Oral, resp. rate 18, height 5\' 10"  (1.778 m), weight 132.904 kg (293 lb), last menstrual period 01/10/2013, unknown if currently breastfeeding.  Physical Exam:  General: alert, cooperative and mild distress Lochia: appropriate Uterine Fundus: firm Incision: N/A DVT Evaluation: No evidence of DVT seen on physical exam. Negative Homan's sign.   Recent Labs  10/23/13 2225  HGB 9.7*  HCT 28.0*    Assessment/Plan: Plan for discharge tomorrow, Breastfeeding and Contraception Unsure about contraception.  May get a BTL in the future.   LOS: 1 day   Misty Cunningham, Misty Cunningham 10/24/2013, 7:47 AM

## 2013-10-24 NOTE — Progress Notes (Signed)
Reported to Philipp DeputyKim Shaw, CNM that patient feels chest tightness and short of breath upon standing up. Reported lungs are clear to auscultation and oxygen saturation is 99%. Philipp DeputyKim Shaw stated it was okay to get patient up to bathroom.

## 2013-10-24 NOTE — Progress Notes (Signed)
I have seen and examined this patient and I agree with the above. Misty Cunningham 12:02 AM 10/24/2013

## 2013-10-24 NOTE — Lactation Note (Addendum)
This note was copied from the chart of Girl Reece LeaderJessica Mattox. Lactation Consultation Note Initial consult:  Experienced Breastfeeder 8 months with daughter and 6 months with son. Mother is able to express drops of colostrum.  Mother states she is having difficulty latching baby. Baby is sleeping in grandmother's arms. Left LC number and told her to call with next feeding. Reviewed basics and encouraged mother to call. Patient Name: Girl Reece LeaderJessica Lauder [id number]'s Date: 10/24/2013 Reason for consult: Initial assessment   Maternal Data Infant to breast within first hour of birth: Yes Has patient been taught Hand Expression?: Yes Does the patient have breastfeeding experience prior to this delivery?: Yes  Feeding    LATCH Score/Interventions                      Lactation Tools Discussed/Used     Consult Status Consult Status: Follow-up Date: 10/25/13 Follow-up type: In-patient    Dahlia ByesBerkelhammer, Shelsie Tijerino [facility] 10/24/2013, 1:43 PM

## 2013-10-24 NOTE — Progress Notes (Signed)
Patient is high PPH risk. Selena Lesserina Braimah student midwife made aware that patient is high PPH and does not have IV access. Notified that bleeding has been normal and uterus is firm.

## 2013-10-24 NOTE — Progress Notes (Signed)
I have seen and examined this patient and I agree with the above. Cam HaiSHAW, KIMBERLY 8:53 AM 10/24/2013

## 2013-10-25 ENCOUNTER — Inpatient Hospital Stay (HOSPITAL_COMMUNITY)
Admission: AD | Admit: 2013-10-25 | Discharge: 2013-10-25 | Disposition: A | Discharge status: Home / Self Care | Payer: Medicaid Other | Source: Ambulatory Visit | Attending: Obstetrics & Gynecology | Admitting: Obstetrics & Gynecology

## 2013-10-25 ENCOUNTER — Encounter (HOSPITAL_COMMUNITY): Payer: Self-pay

## 2013-10-25 DIAGNOSIS — O99893 Other specified diseases and conditions complicating puerperium: Secondary | ICD-10-CM | POA: Insufficient documentation

## 2013-10-25 DIAGNOSIS — R0602 Shortness of breath: Secondary | ICD-10-CM | POA: Insufficient documentation

## 2013-10-25 DIAGNOSIS — O26899 Other specified pregnancy related conditions, unspecified trimester: Secondary | ICD-10-CM

## 2013-10-25 DIAGNOSIS — O909 Complication of the puerperium, unspecified: Secondary | ICD-10-CM

## 2013-10-25 DIAGNOSIS — O9989 Other specified diseases and conditions complicating pregnancy, childbirth and the puerperium: Principal | ICD-10-CM

## 2013-10-25 LAB — CBC
HEMATOCRIT: 25.5 % — AB (ref 36.0–46.0)
Hemoglobin: 8.9 g/dL — ABNORMAL LOW (ref 12.0–15.0)
MCH: 28.5 pg (ref 26.0–34.0)
MCHC: 34.9 g/dL (ref 30.0–36.0)
MCV: 81.7 fL (ref 78.0–100.0)
PLATELETS: 169 10*3/uL (ref 150–400)
RBC: 3.12 MIL/uL — ABNORMAL LOW (ref 3.87–5.11)
RDW: 16 % — AB (ref 11.5–15.5)
WBC: 9.3 10*3/uL (ref 4.0–10.5)

## 2013-10-25 MED ORDER — IBUPROFEN 600 MG PO TABS: 600.0000 mg | ORAL_TABLET | Freq: Four times a day (QID) | ORAL | Status: DC

## 2013-10-25 MED ORDER — PRENATAL MULTIVITAMIN CH: 1.0000 | ORAL_TABLET | Freq: Every day | ORAL | Status: DC

## 2013-10-25 NOTE — MAU Note (Signed)
Pt presents complaining of 2 large clots the size of lemon and shortness of breath. Delivered on 3/27 and discharged today. O2 sat 100%

## 2013-10-25 NOTE — Discharge Summary (Signed)
i agree with above assessment 

## 2013-10-25 NOTE — MAU Provider Note (Signed)
  History   t is 2 day post partum who is back in with c/o SOB and passing a large clot at home. States no heavy bleeding.  CSN: [id number]  Arrival date and time: 10/25/13 1911   None     Chief Complaint  Patient presents with  . Vaginal Bleeding    blood clots   HPI  OB History   Grav Para Term Preterm Abortions TAB SAB Ect Mult Living   3 3 3       3       History reviewed. No pertinent past medical history.  Past Surgical History  Procedure Laterality Date  . Wisdome teeth    . Wisdom tooth extraction      History reviewed. No pertinent family history.  History  Substance Use Topics  . Smoking status: Never Smoker   . Smokeless tobacco: Not on file  . Alcohol Use: No    Allergies: No Known Allergies  Prescriptions prior to admission  Medication Sig Dispense Refill  . ibuprofen (ADVIL,MOTRIN) 600 MG tablet Take 1 tablet (600 mg total) by mouth every 6 (six) hours.  30 tablet  0  . Prenatal Vit-Fe Fumarate-FA (PRENATAL MULTIVITAMIN) TABS tablet Take 1 tablet by mouth daily at 12 noon.  30 tablet  0    Review of Systems  Constitutional: Negative.   HENT: Negative.   Eyes: Negative.   Respiratory: Positive for shortness of breath.   Cardiovascular: Negative.   Gastrointestinal: Negative.   Genitourinary: Negative.   Musculoskeletal: Negative.   Skin: Negative.   Neurological: Negative.   Endo/Heme/Allergies: Negative.   Psychiatric/Behavioral: Negative.    Physical Exam   Blood pressure 123/55, pulse 85, temperature 98.8 F (37.1 C), temperature source Oral, resp. rate 18, last menstrual period 01/10/2013, SpO2 100.00%, unknown if currently breastfeeding.  Physical Exam  Constitutional: She is oriented to person, place, and time. She appears well-developed and well-nourished.  HENT:  Head: Normocephalic.  Eyes: Pupils are equal, round, and reactive to light.  Neck: Normal range of motion. Neck supple.  Cardiovascular: Normal rate, regular rhythm,  normal heart sounds and intact distal pulses.   Respiratory: Effort normal and breath sounds normal.  GI: Soft. Bowel sounds are normal.  Genitourinary: Vagina normal and uterus normal.  Musculoskeletal: Normal range of motion.  Neurological: She is alert and oriented to person, place, and time. She has normal reflexes.  Skin: Skin is warm and dry.  Psychiatric: She has a normal mood and affect. Her behavior is normal. Judgment and thought content normal.    MAU Course  Procedures  MDM D/c home. VSS, o2 sat 100%, lochia sm amt.  Assessment and Plan  LCTAB, o2 sat 100%  Lashawna Poche DARLENE 10/25/2013, 7:48 PM

## 2013-10-25 NOTE — Lactation Note (Signed)
This note was copied from the chart of Misty Reece LeaderJessica Avery. Lactation Consultation Note Follow up consult:  Mother has baby in cradle position.  Sucks and swallows observed. LS9. Encouraged breast compression for a deeper latch. Reviewed engorgement care, pacifier use, feeding 8-12 times a day and comfort gel use. Encouraged mother to call for further assistance if needed. Patient Name: Misty Reece LeaderJessica Cunningham ZOXWR'UToday's Date: 10/25/2013 Reason for consult: Follow-up assessment   Maternal Data    Feeding Feeding Type: Breast Fed  LATCH Score/Interventions Latch: Grasps breast easily, tongue down, lips flanged, rhythmical sucking.  Audible Swallowing: Spontaneous and intermittent  Type of Nipple: Everted at rest and after stimulation  Comfort (Breast/Nipple): Filling, red/small blisters or bruises, mild/mod discomfort  Problem noted: Mild/Moderate discomfort Interventions (Mild/moderate discomfort): Comfort gels;Hand expression  Hold (Positioning): No assistance needed to correctly position infant at breast.  LATCH Score: 9  Lactation Tools Discussed/Used     Consult Status Consult Status: Complete    Dahlia ByesBerkelhammer, Ruth Boschen 10/25/2013, 9:00 AM

## 2013-10-25 NOTE — Discharge Summary (Addendum)
Obstetric Discharge Summary Reason for Admission: onset of labor Prenatal Procedures: none Intrapartum Procedures: spontaneous vaginal delivery Postpartum Procedures: none Complications-Operative and Postpartum: none Hemoglobin  Date Value Ref Range Status  10/23/2013 9.7* 12.0 - 15.0 g/dL Final     HCT  Date Value Ref Range Status  10/23/2013 28.0* 36.0 - 46.0 % Final    Discharge Diagnoses: Term Pregnancy-delivered  Hospital Course:  Misty Cunningham is a 33 y.o. [id number] who presented with active labor.  She had a uncomplicated SVD . She was able to ambulate, tolerate PO and void normally. She was discharged home with instructions for postpartum care.    Delivery Note  At 9:43 PM a viable female was delivered via Vaginal, Spontaneous Delivery (Presentation:occiput; anterior ). APGAR: 9, 9; weight pending.  Placenta status: Intact, Spontaneous. Cord: 3 vessels with the following complications: None. Cord pH: not sent. IM pitocin (10 units) administered after delivery and fundus massaged until firm and hemodynamically stable.  Grandmother, FOB, and 2 children present for delivery. Infant put directly on mom's chest after delivery. Parents bonding with infant.  Anesthesia: None  Episiotomy: None  Lacerations: None  Suture Repair: N/A  Est. Blood Loss (mL): 400  Mom to postpartum. Baby to Couplet care / Skin to Skin.  Physical Exam:  General: alert, cooperative and no distress Lochia: appropriate Uterine Fundus: firm DVT Evaluation: No evidence of DVT seen on physical exam. Negative Homan's sign. No cords or calf tenderness. No significant calf/ankle edema.  Discharge Information: Date: 10/25/2013 Activity: pelvic rest Diet: routine Medications: PNV and Ibuprofen Baby feeding: plans to breastfeed Contraception: considering BTL Condition: stable Instructions: refer to practice specific booklet Discharge to: home   Newborn Data: Live born female  Birth Weight: 7 lb 11.4  oz (3498 g) APGAR: 9, 9  Home with mother.  Misty LowJames Joyner, MD [facility] PGY-1 10/25/2013, 7:51 AM

## 2013-10-25 NOTE — Discharge Instructions (Signed)
Postpartum Care After Vaginal Delivery °After you deliver your newborn (postpartum period), the usual stay in the hospital is 24 72 hours. If there were problems with your labor or delivery, or if you have other medical problems, you might be in the hospital longer.  °While you are in the hospital, you will receive help and instructions on how to care for yourself and your newborn during the postpartum period.  °While you are in the hospital: °· Be sure to tell your nurses if you have pain or discomfort, as well as where you feel the pain and what makes the pain worse. °· If you had an incision made near your vagina (episiotomy) or if you had some tearing during delivery, the nurses may put ice packs on your episiotomy or tear. The ice packs may help to reduce the pain and swelling. °· If you are breastfeeding, you may feel uncomfortable contractions of your uterus for a couple of weeks. This is normal. The contractions help your uterus get back to normal size. °· It is normal to have some bleeding after delivery. °· For the first 1 3 days after delivery, the flow is red and the amount may be similar to a period. °· It is common for the flow to start and stop. °· In the first few days, you may pass some small clots. Let your nurses know if you begin to pass large clots or your flow increases. °· Do not  flush blood clots down the toilet before having the nurse look at them. °· During the next 3 10 days after delivery, your flow should become more watery and pink or brown-tinged in color. °· Ten to fourteen days after delivery, your flow should be a small amount of yellowish-white discharge. °· The amount of your flow will decrease over the first few weeks after delivery. Your flow may stop in 6 8 weeks. Most women have had their flow stop by 12 weeks after delivery. °· You should change your sanitary pads frequently. °· Wash your hands thoroughly with soap and water for at least 20 seconds after changing pads, using  the toilet, or before holding or feeding your newborn. °· You should feel like you need to empty your bladder within the first 6 8 hours after delivery. °· In case you become weak, lightheaded, or faint, call your nurse before you get out of bed for the first time and before you take a shower for the first time. °· Within the first few days after delivery, your breasts may begin to feel tender and full. This is called engorgement. Breast tenderness usually goes away within 48 72 hours after engorgement occurs. You may also notice milk leaking from your breasts. If you are not breastfeeding, do not stimulate your breasts. Breast stimulation can make your breasts produce more milk. °· Spending as much time as possible with your newborn is very important. During this time, you and your newborn can feel close and get to know each other. Having your newborn stay in your room (rooming in) will help to strengthen the bond with your newborn.  It will give you time to get to know your newborn and become comfortable caring for your newborn. °· Your hormones change after delivery. Sometimes the hormone changes can temporarily cause you to feel sad or tearful. These feelings should not last more than a few days. If these feelings last longer than that, you should talk to your caregiver. °· If desired, talk to your caregiver about   methods of family planning or contraception.  Talk to your caregiver about immunizations. Your caregiver may want you to have the following immunizations before leaving the hospital:  Tetanus, diphtheria, and pertussis (Tdap) or tetanus and diphtheria (Td) immunization. It is very important that you and your family (including grandparents) or others caring for your newborn are up-to-date with the Tdap or Td immunizations. The Tdap or Td immunization can help protect your newborn from getting ill.  Rubella immunization.  Varicella (chickenpox) immunization.  Influenza immunization. You should  receive this annual immunization if you did not receive the immunization during your pregnancy. Document Released: 05/13/2007 Document Revised: 04/09/2012 Document Reviewed: 03/12/2012 [facility] Patient Information 2014 [place], [place].  Contraception Choices Contraception (birth control) is the use of any methods or devices to prevent pregnancy. Below are some methods to help avoid pregnancy. HORMONAL METHODS   Contraceptive implant This is a thin, plastic tube containing progesterone hormone. It does not contain estrogen hormone. Your health care provider inserts the tube in the inner part of the upper arm. The tube can remain in place for up to 3 years. After 3 years, the implant must be removed. The implant prevents the ovaries from releasing an egg (ovulation), thickens the cervical mucus to prevent sperm from entering the uterus, and thins the lining of the inside of the uterus.  Progesterone-only injections These injections are given every 3 months by your health care provider to prevent pregnancy. This synthetic progesterone hormone stops the ovaries from releasing eggs. It also thickens cervical mucus and changes the uterine lining. This makes it harder for sperm to survive in the uterus.  Birth control pills These pills contain estrogen and progesterone hormone. They work by preventing the ovaries from releasing eggs (ovulation). They also cause the cervical mucus to thicken, preventing the sperm from entering the uterus. Birth control pills are prescribed by a health care provider.Birth control pills can also be used to treat heavy periods.  Minipill This type of birth control pill contains only the progesterone hormone. They are taken every day of each month and must be prescribed by your health care provider.  Birth control patch The patch contains hormones similar to those in birth control pills. It must be changed once a week and is prescribed by a health care provider.  Vaginal ring  The ring contains hormones similar to those in birth control pills. It is left in the vagina for 3 weeks, removed for 1 week, and then a new one is put back in place. The patient must be comfortable inserting and removing the ring from the vagina.A health care provider's prescription is necessary.  Emergency contraception Emergency contraceptives prevent pregnancy after unprotected sexual intercourse. This pill can be taken right after sex or up to 5 days after unprotected sex. It is most effective the sooner you take the pills after having sexual intercourse. Most emergency contraceptive pills are available without a prescription. Check with your pharmacist. Do not use emergency contraception as your only form of birth control. BARRIER METHODS   Female condom This is a thin sheath (latex or rubber) that is worn over the penis during sexual intercourse. It can be used with spermicide to increase effectiveness.  Female condom. This is a soft, loose-fitting sheath that is put into the vagina before sexual intercourse.  Diaphragm This is a soft, latex, dome-shaped barrier that must be fitted by a health care provider. It is inserted into the vagina, along with a spermicidal jelly. It is inserted  before intercourse. The diaphragm should be left in the vagina for 6 to 8 hours after intercourse.  Cervical cap This is a round, soft, latex or plastic cup that fits over the cervix and must be fitted by a health care provider. The cap can be left in place for up to 48 hours after intercourse.  Sponge This is a soft, circular piece of polyurethane foam. The sponge has spermicide in it. It is inserted into the vagina after wetting it and before sexual intercourse.  Spermicides These are chemicals that kill or block sperm from entering the cervix and uterus. They come in the form of creams, jellies, suppositories, foam, or tablets. They do not require a prescription. They are inserted into the vagina with an  applicator before having sexual intercourse. The process must be repeated every time you have sexual intercourse. INTRAUTERINE CONTRACEPTION  Intrauterine device (IUD) This is a T-shaped device that is put in a woman's uterus during a menstrual period to prevent pregnancy. There are 2 types:  Copper IUD This type of IUD is wrapped in copper wire and is placed inside the uterus. Copper makes the uterus and fallopian tubes produce a fluid that kills sperm. It can stay in place for 10 years.  Hormone IUD This type of IUD contains the hormone progestin (synthetic progesterone). The hormone thickens the cervical mucus and prevents sperm from entering the uterus, and it also thins the uterine lining to prevent implantation of a fertilized egg. The hormone can weaken or kill the sperm that get into the uterus. It can stay in place for 3 5 years, depending on which type of IUD is used. PERMANENT METHODS OF CONTRACEPTION  Female tubal ligation This is when the woman's fallopian tubes are surgically sealed, tied, or blocked to prevent the egg from traveling to the uterus.  Hysteroscopic sterilization This involves placing a small coil or insert into each fallopian tube. Your doctor uses a technique called hysteroscopy to do the procedure. The device causes scar tissue to form. This results in permanent blockage of the fallopian tubes, so the sperm cannot fertilize the egg. It takes about 3 months after the procedure for the tubes to become blocked. You must use another form of birth control for these 3 months.  Female sterilization This is when the female has the tubes that carry sperm tied off (vasectomy).This blocks sperm from entering the vagina during sexual intercourse. After the procedure, the man can still ejaculate fluid (semen). NATURAL PLANNING METHODS  Natural family planning This is not having sexual intercourse or using a barrier method (condom, diaphragm, cervical cap) on days the woman could  become pregnant.  Calendar method This is keeping track of the length of each menstrual cycle and identifying when you are fertile.  Ovulation method This is avoiding sexual intercourse during ovulation.  Symptothermal method This is avoiding sexual intercourse during ovulation, using a thermometer and ovulation symptoms.  Post ovulation method This is timing sexual intercourse after you have ovulated. Regardless of which type or method of contraception you choose, it is important that you use condoms to protect against the transmission of sexually transmitted infections (STIs). Talk with your health care provider about which form of contraception is most appropriate for you. Document Released: 07/16/2005 Document Revised: 03/18/2013 Document Reviewed: 01/08/2013 [facility] Patient Information 2014 [place], [place].

## 2013-10-26 NOTE — Progress Notes (Signed)
Post discharge ur review completed. 

## 2013-10-26 NOTE — MAU Provider Note (Signed)
Attestation of Attending Supervision of Advanced Practitioner (CNM/NP): Evaluation and management procedures were performed by the Advanced Practitioner under my supervision and collaboration.  I have reviewed the Advanced Practitioner's note and chart, and I agree with the management and plan.  Misty Cunningham, Misty Cunningham 7:28 AM     

## 2013-10-27 ENCOUNTER — Other Ambulatory Visit: Payer: Medicaid Other

## 2013-11-04 ENCOUNTER — Ambulatory Visit: Payer: Medicaid Other | Admitting: Family Medicine

## 2013-11-25 ENCOUNTER — Encounter: Payer: Self-pay | Admitting: *Deleted

## 2013-11-25 ENCOUNTER — Ambulatory Visit: Payer: Medicaid Other | Admitting: Advanced Practice Midwife

## 2013-11-25 ENCOUNTER — Telehealth: Payer: Self-pay | Admitting: *Deleted

## 2013-11-25 NOTE — Telephone Encounter (Signed)
Called patient to inform of missed appointment.  No answer, left message for patient to return call to clinic to reschedule.   Letter sent.

## 2013-12-14 ENCOUNTER — Encounter: Payer: Self-pay | Admitting: *Deleted

## 2013-12-14 ENCOUNTER — Telehealth: Payer: Self-pay | Admitting: *Deleted

## 2013-12-14 NOTE — Telephone Encounter (Signed)
Consulted with Dr. Debroah LoopArnold, permission to return to work given.  Letter typed.  Called patient and informed letter ready.

## 2013-12-14 NOTE — Telephone Encounter (Signed)
Spoke with patient, pt states she delivered 10/23/2013, vaginally.  She states she is not having any problems.  Pt advised to reschedule postpartum appointment.  Informed patient I will consult with physician this afternoon and call her back regarding note to return back to work.  Pt verbalizes understanding.

## 2013-12-14 NOTE — Telephone Encounter (Signed)
Pt called clinic and states she missed her postpartum appointment and need note to return back to work.

## 2013-12-23 ENCOUNTER — Encounter: Payer: Self-pay | Admitting: General Practice

## 2014-05-31 ENCOUNTER — Encounter (HOSPITAL_COMMUNITY): Payer: Self-pay

## 2014-07-01 ENCOUNTER — Encounter: Payer: Self-pay | Admitting: Obstetrics & Gynecology

## 2016-09-02 ENCOUNTER — Emergency Department (HOSPITAL_COMMUNITY)
Admission: EM | Admit: 2016-09-02 | Discharge: 2016-09-02 | Disposition: A | Discharge status: Home / Self Care | Payer: Medicaid Other | Attending: Emergency Medicine | Admitting: Emergency Medicine

## 2016-09-02 ENCOUNTER — Encounter (HOSPITAL_COMMUNITY): Payer: Self-pay | Admitting: *Deleted

## 2016-09-02 DIAGNOSIS — L509 Urticaria, unspecified: Secondary | ICD-10-CM | POA: Diagnosis not present

## 2016-09-02 DIAGNOSIS — R21 Rash and other nonspecific skin eruption: Secondary | ICD-10-CM

## 2016-09-02 MED ORDER — HYDROCORTISONE 1 % EX CREA: TOPICAL_CREAM | CUTANEOUS | 2 refills | Status: DC

## 2016-09-02 MED ORDER — DIPHENHYDRAMINE HCL 25 MG PO CAPS: 25.0000 mg | ORAL_CAPSULE | ORAL | 0 refills | Status: DC | PRN

## 2016-09-02 NOTE — ED Provider Notes (Signed)
[facility]-EMERGENCY DEPT Provider Note   CSN: [id number] Arrival date & time: 09/02/16  1403  By signing my name below, I, [name], attest that this documentation has been prepared under the direction and in the presence of Marily MemosJason Pearlee Arvizu, MD.  Electronically Signed: Octavia HeirArianna Nassar, ED Scribe. 09/02/16. 3:18 PM.    History   Chief Complaint Chief Complaint  Patient presents with  . Rash    The history is provided by the patient. No language interpreter was used.   HPI Comments: Misty LeaderJessica Cunningham is a 36 y.o. female who presents to the Emergency Department presenting with a generalized rash that began a few days ago. Pt has an itchy rash to her bilateral arms, diffuse abdomen, left shin and lower back. She believes that she is staying at a motel that has confirmed bed bugs by [employer].  Pt notes that she has washed her bedding and clothing but reports still having a rash. Pt says that she changed rooms and her rash has not gotten any better. Pt reports using home remedies including witch hazel, rubbing alcohol and lemon juice without relief. She has not taken any benadryl or applied steroid cream to her rash. Pt does not have any other complaints.    History reviewed. No pertinent past medical history.  Patient Active Problem List   Diagnosis Date Noted  . Active labor at term 10/23/2013  . Sickle cell trait (HCC) 06/19/2013  . Supervision of normal subsequent pregnancy 06/16/2013    Past Surgical History:  Procedure Laterality Date  . WISDOM TOOTH EXTRACTION    . wisdome teeth      OB History    Gravida Para Term Preterm AB Living   3 3 3     3    SAB TAB Ectopic Multiple Live Births           3       Home Medications    Prior to Admission medications   Medication Sig Start Date End Date Taking? Authorizing Provider  diphenhydrAMINE (BENADRYL) 25 mg capsule Take 1 capsule (25 mg total) by mouth every 4 (four) hours as needed for itching. 09/02/16   Marily MemosJason Ghalia Reicks, MD    hydrocortisone cream 1 % Apply to affected area 2 times daily 09/02/16   Marily MemosJason Lelania Bia, MD  ibuprofen (ADVIL,MOTRIN) 600 MG tablet Take 1 tablet (600 mg total) by mouth every 6 (six) hours. 10/25/13   Jamal CollinJames R Joyner, MD  Prenatal Vit-Fe Fumarate-FA (PRENATAL MULTIVITAMIN) TABS tablet Take 1 tablet by mouth daily at 12 noon. 10/25/13   Jamal CollinJames R Joyner, MD    Family History History reviewed. No pertinent family history.  Social History Social History  Substance Use Topics  . Smoking status: Never Smoker  . Smokeless tobacco: Not on file  . Alcohol use No     Allergies   Patient has no known allergies.   Review of Systems Review of Systems  Skin: Positive for rash.  All other systems reviewed and are negative.    Physical Exam Updated Vital Signs BP (!) 126/49 (BP Location: Left Arm)   Pulse 67   Temp 99.3 F (37.4 C) (Oral)   Resp 18   SpO2 98%   Physical Exam  Constitutional: She is oriented to person, place, and time. She appears well-developed and well-nourished.  HENT:  Head: Normocephalic.  Eyes: EOM are normal.  Neck: Normal range of motion.  Pulmonary/Chest: Effort normal.  Abdominal: She exhibits no distension.  Musculoskeletal: Normal range of motion.  Neurological:  She is alert and oriented to person, place, and time.  Skin: Rash noted.  Hives to proximal medial aspect of left foream and posterior upper arm on the right. There is an erythematous, swollenn area to the left shin and small areas of increased swelling to the left lower back. Pt also has hives diffusely on her abdomen that is more prominent on RLQ  Psychiatric: She has a normal mood and affect.  Nursing note and vitals reviewed.    ED Treatments / Results  DIAGNOSTIC STUDIES: Oxygen Saturation is 98% on RA, normal by my interpretation.  COORDINATION OF CARE:  3:15 PM Discussed treatment plan with pt at bedside and pt agreed to plan.  Labs (all labs ordered are listed, but only abnormal  results are displayed) Labs Reviewed - No data to display  EKG  EKG Interpretation None       Radiology No results found.  Procedures Procedures (including critical care time)  Medications Ordered in ED Medications - No data to display   Initial Impression / Assessment and Plan / ED Course  I have reviewed the triage vital signs and the nursing notes.  Pertinent labs & imaging results that were available during my care of the patient were reviewed by me and considered in my medical decision making (see chart for details).     Here with localizeed immune reaction to bedbugs. Will rx benadryl and localized hydrocortisone cream for symptoms. Already knows bedbug precautions/treatment otherwise. Doubt allergies.   Final Clinical Impressions(s) / ED Diagnoses   Final diagnoses:  Rash    New Prescriptions Discharge Medication List as of 09/02/2016  3:17 PM    START taking these medications   Details  diphenhydrAMINE (BENADRYL) 25 mg capsule Take 1 capsule (25 mg total) by mouth every 4 (four) hours as needed for itching., Starting Sun 09/02/2016, Print    hydrocortisone cream 1 % Apply to affected area 2 times daily, Print       I personally performed the services described in this documentation, which was scribed in my presence. The recorded information has been reviewed and is accurate.    Marily Memos, MD 09/02/16 [phone]

## 2016-09-02 NOTE — ED Triage Notes (Signed)
Reports recent bedbugs, still has rash and itching to arms and torso.

## 2016-09-02 NOTE — ED Notes (Signed)
Declined W/C at D/C and was escorted to lobby by RN. 

## 2016-09-02 NOTE — ED Triage Notes (Signed)
Possible bed bugs

## 2020-10-06 ENCOUNTER — Emergency Department (HOSPITAL_COMMUNITY)
Admission: EM | Admit: 2020-10-06 | Discharge: 2020-10-06 | Disposition: A | Discharge status: Home / Self Care | Payer: Medicaid Other | Attending: Emergency Medicine | Admitting: Emergency Medicine

## 2020-10-06 ENCOUNTER — Other Ambulatory Visit: Payer: Self-pay

## 2020-10-06 DIAGNOSIS — H5711 Ocular pain, right eye: Secondary | ICD-10-CM | POA: Insufficient documentation

## 2020-10-06 MED ORDER — TETRACAINE HCL 0.5 % OP SOLN
2.0000 [drp] | Freq: Once | OPHTHALMIC | Status: AC
  Administered 2020-10-06: 2 [drp] via OPHTHALMIC
  Filled 2020-10-06: qty 4

## 2020-10-06 MED ORDER — FLUORESCEIN SODIUM 1 MG OP STRP
1.0000 | ORAL_STRIP | Freq: Once | OPHTHALMIC | Status: AC
  Administered 2020-10-06: 1 via OPHTHALMIC
  Filled 2020-10-06: qty 1

## 2020-10-06 NOTE — ED Notes (Signed)
Pt refused having temperature taken

## 2020-10-06 NOTE — ED Provider Notes (Signed)
[facility] EMERGENCY DEPARTMENT Provider Note   CSN: [id number] Arrival date & time: 10/06/20  1343     History Chief Complaint  Patient presents with  . Eye Problem    Misty Cunningham is a 40 y.o. female.  40 year old female presents with 1 week of right eye pain that is worse in the evening.  Denies any history of trauma.  Does not wear contact lens.  No fever or chills.  Denies any purulent drainage.  Denies any pressure to the eye.  States it comes and goes.  Has been using over-the-counter medication without relief.  Denies any history of glaucoma.        No past medical history on file.  Patient Active Problem List   Diagnosis Date Noted  . Active labor at term 10/23/2013  . Sickle cell trait (HCC) 06/19/2013  . Supervision of normal subsequent pregnancy 06/16/2013    Past Surgical History:  Procedure Laterality Date  . WISDOM TOOTH EXTRACTION    . wisdome teeth       OB History    Gravida  3   Para  3   Term  3   Preterm      AB      Living  3     SAB      IAB      Ectopic      Multiple      Live Births  3           No family history on file.  Social History   Tobacco Use  . Smoking status: Never Smoker  Substance Use Topics  . Alcohol use: No  . Drug use: No    Home Medications Prior to Admission medications   Medication Sig Start Date End Date Taking? Authorizing Provider  diphenhydrAMINE (BENADRYL) 25 mg capsule Take 1 capsule (25 mg total) by mouth every 4 (four) hours as needed for itching. 09/02/16   Mesner, Barbara Cower, MD  hydrocortisone cream 1 % Apply to affected area 2 times daily 09/02/16   Mesner, Barbara Cower, MD  ibuprofen (ADVIL,MOTRIN) 600 MG tablet Take 1 tablet (600 mg total) by mouth every 6 (six) hours. 10/25/13   Jamal Collin, MD  Prenatal Vit-Fe Fumarate-FA (PRENATAL MULTIVITAMIN) TABS tablet Take 1 tablet by mouth daily at 12 noon. 10/25/13   Jamal Collin, MD    Allergies    Patient has no  known allergies.  Review of Systems   Review of Systems  All other systems reviewed and are negative.   Physical Exam Updated Vital Signs BP 100/65 (BP Location: Right Arm)   Pulse 63   Temp 98 F (36.7 C) (Oral)   Resp 15   Ht 1.778 m (5\' 10" )   SpO2 100%   BMI 42.04 kg/m   Physical Exam Vitals and nursing note reviewed.  Constitutional:      General: She is not in acute distress.    Appearance: Normal appearance. She is well-developed. She is not toxic-appearing.  HENT:     Head: Normocephalic and atraumatic.  Eyes:     General: Lids are normal.     Conjunctiva/sclera:     Right eye: Right conjunctiva is injected. No exudate or hemorrhage.    Pupils: Pupils are equal, round, and reactive to light.     Funduscopic exam:    Right eye: No exudate.     Slit lamp exam:    Right eye: No foreign body.  Comments: Patient's intraocular pressure was 27 and 29 in the right eye.  No fluorescein uptake was noted.  Neck:     Thyroid: No thyroid mass.     Trachea: No tracheal deviation.  Cardiovascular:     Rate and Rhythm: Normal rate and regular rhythm.     Heart sounds: Normal heart sounds. No murmur heard. No gallop.   Pulmonary:     Effort: Pulmonary effort is normal. No respiratory distress.     Breath sounds: Normal breath sounds. No stridor. No decreased breath sounds, wheezing, rhonchi or rales.  Abdominal:     General: Bowel sounds are normal. There is no distension.     Palpations: Abdomen is soft.     Tenderness: There is no abdominal tenderness. There is no rebound.  Musculoskeletal:        General: No tenderness. Normal range of motion.     Cervical back: Normal range of motion and neck supple.  Skin:    General: Skin is warm and dry.     Findings: No abrasion or rash.  Neurological:     Mental Status: She is alert and oriented to person, place, and time.     GCS: GCS eye subscore is 4. GCS verbal subscore is 5. GCS motor subscore is 6.     Cranial  Nerves: No cranial nerve deficit.     Sensory: No sensory deficit.  Psychiatric:        Speech: Speech normal.        Behavior: Behavior normal.     ED Results / Procedures / Treatments   Labs (all labs ordered are listed, but only abnormal results are displayed) Labs Reviewed - No data to display  EKG None  Radiology No results found.  Procedures Procedures   Medications Ordered in ED Medications  fluorescein ophthalmic strip 1 strip (has no administration in time range)  tetracaine (PONTOCAINE) 0.5 % ophthalmic solution 2 drop (has no administration in time range)    ED Course  I have reviewed the triage vital signs and the nursing notes.  Pertinent labs & imaging results that were available during my care of the patient were reviewed by me and considered in my medical decision making (see chart for details).    MDM Rules/Calculators/A&P                          Case discussed with Dr. Cathey Endow who is on-call for ophthalmology and he will see the patient in the morning Final Clinical Impression(s) / ED Diagnoses Final diagnoses:  None    Rx / DC Orders ED Discharge Orders    None       Lorre Nick, MD 10/06/20 2151

## 2020-10-06 NOTE — ED Triage Notes (Signed)
Pt reports eye pain and sensitivity to light X1 week. Pt is wearing a black hood over her head.

## 2020-10-06 NOTE — ED Notes (Signed)
Reviewed discharge instructions with patient. Follow-up care reviewed. Patient verbalized understanding. Patient A&Ox4, VSS, and ambulatory with steady gait upon discharge.  

## 2020-10-07 DIAGNOSIS — H2000 Unspecified acute and subacute iridocyclitis: Secondary | ICD-10-CM | POA: Diagnosis not present

## 2020-10-11 DIAGNOSIS — H2000 Unspecified acute and subacute iridocyclitis: Secondary | ICD-10-CM | POA: Diagnosis not present

## 2020-10-18 DIAGNOSIS — H2000 Unspecified acute and subacute iridocyclitis: Secondary | ICD-10-CM | POA: Diagnosis not present

## 2020-11-02 ENCOUNTER — Other Ambulatory Visit (HOSPITAL_COMMUNITY): Payer: Self-pay

## 2020-11-08 DIAGNOSIS — H2000 Unspecified acute and subacute iridocyclitis: Secondary | ICD-10-CM | POA: Diagnosis not present

## 2021-04-26 ENCOUNTER — Ambulatory Visit: Payer: Self-pay | Admitting: Nurse Practitioner

## 2021-06-19 ENCOUNTER — Ambulatory Visit: Payer: Medicaid Other | Admitting: Nurse Practitioner

## 2021-10-17 ENCOUNTER — Ambulatory Visit: Payer: Medicaid Other | Attending: Internal Medicine | Admitting: Internal Medicine

## 2021-10-17 ENCOUNTER — Encounter: Payer: Self-pay | Admitting: Internal Medicine

## 2021-10-17 ENCOUNTER — Other Ambulatory Visit: Payer: Self-pay | Admitting: *Deleted

## 2021-10-17 ENCOUNTER — Other Ambulatory Visit: Payer: Self-pay

## 2021-10-17 VITALS — BP 104/73 | HR 102 | Resp 16 | Ht 70.0 in | Wt 190.4 lb

## 2021-10-17 DIAGNOSIS — D509 Iron deficiency anemia, unspecified: Secondary | ICD-10-CM

## 2021-10-17 DIAGNOSIS — R0982 Postnasal drip: Secondary | ICD-10-CM | POA: Diagnosis not present

## 2021-10-17 DIAGNOSIS — E049 Nontoxic goiter, unspecified: Secondary | ICD-10-CM | POA: Insufficient documentation

## 2021-10-17 DIAGNOSIS — Z7689 Persons encountering health services in other specified circumstances: Secondary | ICD-10-CM | POA: Diagnosis not present

## 2021-10-17 DIAGNOSIS — E663 Overweight: Secondary | ICD-10-CM

## 2021-10-17 DIAGNOSIS — K5229 Other allergic and dietetic gastroenteritis and colitis: Secondary | ICD-10-CM

## 2021-10-17 MED ORDER — LORATADINE 10 MG PO TABS: 10.0000 mg | ORAL_TABLET | Freq: Every day | ORAL | 3 refills | Status: DC | PRN

## 2021-10-17 MED ORDER — FLUTICASONE PROPIONATE 50 MCG/ACT NA SUSP: 1.0000 | Freq: Every day | NASAL | 1 refills | Status: AC

## 2021-10-17 NOTE — Progress Notes (Addendum)
? ? ?Patient ID: Misty Cunningham, female    DOB: [birth year]  MRN: [id number] ? ?CC: New Patient (Initial Visit) ? ? ?Subjective: ?Misty Cunningham is a 41 y.o. female who presents for new pt visit.  Berna Spare, her significant other is with her today ?Her concerns today include:  ? ?No previous PCP in a while. ?No chronic medical issues other than inflammation RT eye.   ? ?Vegan for 7 yrs until last June. ?Prior to going full vegan she was not eating beef or pork for yrs. ?Decided to stop being vegan because she felt she was not absorbing vitamins.  She also started having bad acne and issues with constipation with blood in the stools from having to strain and bloating ?Took a commercial microbiome home [employer] test.  This involved submitting a stool sample to the company. Results showed that she was allergic to potatoes, pineapples and other types of foods.  She states that the study also told her which type of gut bacteria she had too much of and  not enough of. ?Adjusted diet based on results and feedback she received from [employer].  She also eliminated wheat/gluten from her diet.  Her GI symptoms and acne improved.  She takes various supplements including vitamin D, C, B1, magnesium and zinc.  She does not take them every day.  She tells me she rotates them.  She is also taking probiotic and prebiotic supplement and activated charcoal pills intermittently as a bowel cleanse. ?-Since she started eating meat and dairy products, she has noted increased mucus in her throat and nose. ?Denies any itching or rash at this time. ?Moving bowels okay. ?She feels she eats fairly healthy.  She does yoga for exercise.  Little overweight for height based on BMI.  However she states she has been able to maintain her weight pretty good but did better when she was vegan. ?She is wondering whether she should be referred to a specialist because she feels she may have done some damage to her bowel over time from being vegan. ? ?Patient Active  Problem List  ? Diagnosis Date Noted  ? Active labor at term 10/23/2013  ? Sickle cell trait (HCC) 06/19/2013  ? Supervision of normal subsequent pregnancy 06/16/2013  ?  ? ?No current outpatient medications on file prior to visit.  ? ?No current facility-administered medications on file prior to visit.  ? ? ?No Known Allergies ? ?Social History  ? ?Socioeconomic History  ? Marital status: Single  ?  Spouse name: Not on file  ? Number of children: 3  ? Years of education: Not on file  ? Highest education level: Some college, no degree  ?Occupational History  ? Occupation: Free lancing - [employer]  ?Tobacco Use  ? Smoking status: Never  ? Smokeless tobacco: Not on file  ?Vaping Use  ? Vaping Use: Never used  ?Substance and Sexual Activity  ? Alcohol use: No  ? Drug use: No  ? Sexual activity: Yes  ?  Birth control/protection: None  ?Other Topics Concern  ? Not on file  ?Social History Narrative  ? Not on file  ? ?Social Determinants of Health  ? ?Financial Resource Strain: Not on file  ?Food Insecurity: Not on file  ?Transportation Needs: Not on file  ?Physical Activity: Not on file  ?Stress: Not on file  ?Social Connections: Not on file  ?Intimate Partner Violence: Not on file  ? ? ?History reviewed. No pertinent family history. ? ?Past Surgical History:  ?Procedure  Laterality Date  ? WISDOM TOOTH EXTRACTION    ? wisdome teeth    ? ? ?ROS: ?Review of Systems ?Negative except as stated above ? ?PHYSICAL EXAM: ?BP 104/73   Pulse (!) 102   Resp 16   Ht 5\' 10"  (1.778 m)   Wt 190 lb 6.4 oz (86.4 kg)   LMP 10/14/2021   SpO2 97%   BMI 27.32 kg/m?   ?Physical Exam ? ?General appearance - alert, well appearing, and in no distress ?Mental status - normal mood, behavior, speech, dress, motor activity, and thought processes ?Eyes -slightly pale conjunctiva ?Mouth -oral mucosa is moist.  She is noted to have some drainage at the back of the throat.  Both tonsils.  No erythema of the throat.  No exudates ?Neck -slightly  enlarged thyroid bilaterally, no thyroid nodules appreciated. ?Lymphatics - no palpable lymphadenopathy, no hepatosplenomegaly ?Chest - clear to auscultation, no wheezes, rales or rhonchi, symmetric air entry ?Heart - normal rate, regular rhythm, normal S1, S2, no murmurs, rubs, clicks or gallops ?Abdomen - soft, nontender, nondistended, no masses or organomegaly ?Extremities - peripheral pulses normal, no pedal edema, no clubbing or cyanosis ? ? ?CBC ?   ?Component Value Date/Time  ? WBC 9.3 10/25/2013 1945  ? RBC 3.12 (L) 10/25/2013 1945  ? HGB 8.9 (L) 10/25/2013 1945  ? HCT 25.5 (L) 10/25/2013 1945  ? PLT 169 10/25/2013 1945  ? MCV 81.7 10/25/2013 1945  ? MCH 28.5 10/25/2013 1945  ? MCHC 34.9 10/25/2013 1945  ? RDW 16.0 (H) 10/25/2013 1945  ? LYMPHSABS 1.3 06/16/2013 1542  ? MONOABS 0.6 06/16/2013 1542  ? EOSABS 0.1 06/16/2013 1542  ? BASOSABS 0.0 06/16/2013 1542  ? ? ?ASSESSMENT AND PLAN: ?1. Establishing care with new doctor, encounter for ? ? ?2. Gastrointestinal food allergy ?-Informed patient that people who have the again can develop certain deficiencies including iron deficiency due to lack of iron from animal meat and B12 deficiency.  Should supplement well with these vitamins or take a multivitamin tablet when on a vegan diet.  She is no longer vegan but she has concerns that eating meat is probably causing the mucus in her throat and nose.  Based on history and exam it looks like she has postnasal drip as a cause of this. ?-Encourage eating healthy balanced meals including fresh fruits and vegetables.  Advised against bowel cleanse with charcoal.  Went over the systems in the body that helps in the detoxification including the lungs, liver, bowel and kidneys. ?We will screen for celiac disease given reports of skin changes and bowel symptoms that improved upon eliminating wheat and gluten from the diet.  I will also refer her to an allergist to check for food allergies. ?-Advised that I would take with  a grain of salt results of commercial studies like the one that she did but supposedly examined discussed health based on stool sample. ?- CBC ?- Comprehensive metabolic panel ?- Ambulatory referral to Allergy ?- Tissue Transglutaminase Abs,IgG,IgA ? ?3. Thyroid enlargement ?- TSH+T4F+T3Free ? ?4. Post-nasal drip ?- fluticasone (FLONASE) 50 MCG/ACT nasal spray; Place 1 spray into both nostrils daily.  Dispense: 16 g; Refill: 1 ?- loratadine (CLARITIN) 10 MG tablet; Take 1 tablet (10 mg total) by mouth daily as needed for allergies.  Dispense: 30 tablet; Refill: 3 ? ?5. Overweight (BMI 25.0-29.9) ?Encourage healthy eating and regular exercise. ?- Lipid panel ? ?Addendum: Patient with microcytic anemia on blood test.  I will add iron studies. ?Patient was  given the opportunity to ask questions.  Patient verbalized understanding of the plan and was able to repeat key elements of the plan.  ? ?This documentation was completed using Paediatric nurse.  Any transcriptional errors are unintentional. ? ?Orders Placed This Encounter  ?Procedures  ? CBC  ? Comprehensive metabolic panel  ? TSH+T4F+T3Free  ? Lipid panel  ? Tissue Transglutaminase Abs,IgG,IgA  ? Ambulatory referral to Allergy  ? ? ? ?Requested Prescriptions  ? ?Signed Prescriptions Disp Refills  ? fluticasone (FLONASE) 50 MCG/ACT nasal spray 16 g 1  ?  Sig: Place 1 spray into both nostrils daily.  ? loratadine (CLARITIN) 10 MG tablet 30 tablet 3  ?  Sig: Take 1 tablet (10 mg total) by mouth daily as needed for allergies.  ? ? ?No follow-ups on file. ? ?Jonah Blue, MD, FACP ?

## 2021-10-18 NOTE — Addendum Note (Signed)
Addended by: Karle Plumber B on: 10/18/2021 12:24 PM ? ? Modules accepted: Orders ? ?

## 2021-10-20 LAB — TSH+T4F+T3FREE
Free T4: 1.49 ng/dL (ref 0.82–1.77)
T3, Free: 2.7 pg/mL (ref 2.0–4.4)
TSH: 1.08 u[IU]/mL (ref 0.450–4.500)

## 2021-10-20 LAB — COMPREHENSIVE METABOLIC PANEL
ALT: 5 IU/L (ref 0–32)
AST: 14 IU/L (ref 0–40)
Albumin/Globulin Ratio: 1.5 (ref 1.2–2.2)
Albumin: 4.5 g/dL (ref 3.8–4.8)
Alkaline Phosphatase: 56 IU/L (ref 44–121)
BUN/Creatinine Ratio: 9 (ref 9–23)
BUN: 9 mg/dL (ref 6–20)
Bilirubin Total: 1 mg/dL (ref 0.0–1.2)
CO2: 21 mmol/L (ref 20–29)
Calcium: 9.6 mg/dL (ref 8.7–10.2)
Chloride: 98 mmol/L (ref 96–106)
Creatinine, Ser: 1.02 mg/dL — ABNORMAL HIGH (ref 0.57–1.00)
Globulin, Total: 3 g/dL (ref 1.5–4.5)
Glucose: 97 mg/dL (ref 70–99)
Potassium: 4.4 mmol/L (ref 3.5–5.2)
Sodium: 133 mmol/L — ABNORMAL LOW (ref 134–144)
Total Protein: 7.5 g/dL (ref 6.0–8.5)
eGFR: 72 mL/min/{1.73_m2} (ref >59)

## 2021-10-20 LAB — CBC
Hematocrit: 31.5 % — ABNORMAL LOW (ref 34.0–46.6)
Hemoglobin: 10.4 g/dL — ABNORMAL LOW (ref 11.1–15.9)
MCH: 24.6 pg — ABNORMAL LOW (ref 26.6–33.0)
MCHC: 33 g/dL (ref 31.5–35.7)
MCV: 75 fL — ABNORMAL LOW (ref 79–97)
Platelets: 229 10*3/uL (ref 150–450)
RBC: 4.23 x10E6/uL (ref 3.77–5.28)
RDW: 17 % — ABNORMAL HIGH (ref 11.7–15.4)
WBC: 6.1 10*3/uL (ref 3.4–10.8)

## 2021-10-20 LAB — LIPID PANEL
Chol/HDL Ratio: 4.3 ratio (ref 0.0–4.4)
Cholesterol, Total: 138 mg/dL (ref 100–199)
HDL: 32 mg/dL — ABNORMAL LOW (ref >39)
LDL Chol Calc (NIH): 91 mg/dL (ref 0–99)
Triglycerides: 77 mg/dL (ref 0–149)
VLDL Cholesterol Cal: 15 mg/dL (ref 5–40)

## 2021-10-20 LAB — TISSUE TRANSGLUTAMINASE ABS,IGG,IGA
Tissue Transglut Ab: <2 U/mL (ref 0–5)
Transglutaminase IgA: <2 U/mL (ref 0–3)

## 2021-10-21 ENCOUNTER — Other Ambulatory Visit: Payer: Self-pay | Admitting: Internal Medicine

## 2021-10-21 LAB — IRON,TIBC AND FERRITIN PANEL
Ferritin: 57 ng/mL (ref 15–150)
Iron Saturation: 2 % — CL (ref 15–55)
Iron: 7 ug/dL — CL (ref 27–159)
Total Iron Binding Capacity: 354 ug/dL (ref 250–450)
UIBC: 347 ug/dL (ref 131–425)

## 2021-10-21 LAB — SPECIMEN STATUS REPORT

## 2021-10-21 MED ORDER — FERROUS SULFATE 325 (65 FE) MG PO TABS: 325.0000 mg | ORAL_TABLET | Freq: Every day | ORAL | 0 refills | Status: AC

## 2021-10-21 NOTE — Progress Notes (Signed)
Let patient know that she has some mild anemia.  Iron studies suggest she has iron deficiency.  I recommend taking iron supplement 325 mg daily.  I will send this prescription to her pharmacy.  Cholesterol levels are good.  Blood sugar level normal.  Kidney and liver function tests good.  Thyroid level normal.  Screen for celiac disease, which is an autoimmune disease that leads to gluten sensitivity, is negative.

## 2023-07-15 ENCOUNTER — Ambulatory Visit: Payer: Medicaid Other

## 2023-07-17 ENCOUNTER — Encounter (HOSPITAL_COMMUNITY): Payer: Self-pay

## 2023-07-17 ENCOUNTER — Ambulatory Visit (HOSPITAL_COMMUNITY)
Admission: RE | Admit: 2023-07-17 | Discharge: 2023-07-17 | Disposition: A | Discharge status: Home / Self Care | Payer: Medicaid Other | Source: Ambulatory Visit | Attending: Family Medicine | Admitting: Family Medicine

## 2023-07-17 VITALS — BP 101/71 | HR 89 | Temp 97.7°F | Resp 16

## 2023-07-17 DIAGNOSIS — H5712 Ocular pain, left eye: Secondary | ICD-10-CM | POA: Diagnosis not present

## 2023-07-17 MED ORDER — TETRACAINE HCL 0.5 % OP SOLN
OPHTHALMIC | Status: AC
  Filled 2023-07-17: qty 4

## 2023-07-17 MED ORDER — PREDNISOLONE ACETATE 1 % OP SUSP: 1.0000 [drp] | Freq: Four times a day (QID) | OPHTHALMIC | 0 refills | Status: AC

## 2023-07-17 MED ORDER — FLUORESCEIN SODIUM 1 MG OP STRP
ORAL_STRIP | OPHTHALMIC | Status: AC
  Filled 2023-07-17: qty 1

## 2023-07-17 NOTE — ED Triage Notes (Signed)
Pt presents with right eye redness and pain that began two days ago.  She thinks it is food related possible due to gluten.   She has had this before and was referred to optometrist who gave her steroids and eye drops and told her to follow up with PCP. PCP did blood and did not find anything.

## 2023-07-18 NOTE — ED Provider Notes (Signed)
  [facility]   [id number] 07/17/23 Arrival Time: 1423  ASSESSMENT & PLAN:  1. Eye pain, left    She will call Dr Cathey Endow in the morning to arrange f/u hopefully within the next 24-48 hours.  Question episcleritis given recurrent nature. No signs of infection.  Begin: Meds ordered this encounter  Medications   prednisoLONE acetate (PRED FORTE) 1 % ophthalmic suspension    Sig: Place 1 drop into the right eye 4 (four) times daily.    Dispense:  5 mL    Refill:  0   ED if abrupt worsening.  Reviewed expectations re: course of current medical issues. Questions answered. Outlined signs and symptoms indicating need for more acute intervention. Patient verbalized understanding. After Visit Summary given.   SUBJECTIVE:  Misty Cunningham is a 42 y.o. female who presents with complaint of right eye redness and pain that began two days ago. Stable but uncomfortable H/O similar. Has seen Dr Cathey Endow; reports she was prescribed steroid gtts which resolved the problem. Denies injury. Does not wear contact lenses. She thinks it is food related possible due to gluten.    OBJECTIVE:  Vitals:   07/17/23 1502  BP: 101/71  Pulse: 89  Resp: 16  Temp: 97.7 F (36.5 C)  TempSrc: Oral  SpO2: 99%    General appearance: alert; no distress HEENT: [place]; AT; PERRLA; no restriction of the extraocular movements OS: normal OD: with reported pain; with mostly lateral conjunctival injection; without drainage; without corneal opacities (including no fluorescein uptake); without limbal flush; without periorbital swelling or erythema; intraocular pressure 22 and 23 Neck: supple without LAD Lungs: clear to auscultation bilaterally; unlabored respirations Heart: regular rate and rhythm Skin: warm and dry Psychological: alert and cooperative; normal mood and affect   Visual Acuity Visual Acuity Bilateral Distance: 20/20 R Distance: 20/30 L Distance: 20/30    No Known Allergies  History  reviewed. No pertinent past medical history. Social History   Socioeconomic History   Marital status: Single    Spouse name: Not on file   Number of children: 3   Years of education: Not on file   Highest education level: Some college, no degree  Occupational History   Occupation: Free lancing - [employer]  Tobacco Use   Smoking status: Never   Smokeless tobacco: Not on file  Vaping Use   Vaping status: Never Used  Substance and Sexual Activity   Alcohol use: No   Drug use: No   Sexual activity: Yes    Birth control/protection: None  Other Topics Concern   Not on file  Social History Narrative   Not on file   Social Drivers of Health   Financial Resource Strain: Not on file  Food Insecurity: Not on file  Transportation Needs: Not on file  Physical Activity: Not on file  Stress: Not on file  Social Connections: Not on file  Intimate Partner Violence: Not on file   History reviewed. No pertinent family history. Past Surgical History:  Procedure Laterality Date   WISDOM TOOTH EXTRACTION     wisdome teeth        Mardella Layman, MD 07/18/23 [phone]

## 2023-08-14 ENCOUNTER — Ambulatory Visit: Payer: Medicaid Other | Admitting: Internal Medicine

## 2023-08-29 ENCOUNTER — Encounter: Payer: Self-pay | Admitting: Internal Medicine

## 2023-08-29 ENCOUNTER — Other Ambulatory Visit: Payer: Self-pay

## 2023-08-29 ENCOUNTER — Ambulatory Visit: Payer: Medicaid Other | Admitting: Internal Medicine

## 2023-08-29 VITALS — BP 138/82 | HR 92 | Temp 98.0°F | Resp 18 | Ht 70.0 in | Wt 198.5 lb

## 2023-08-29 DIAGNOSIS — K589 Irritable bowel syndrome without diarrhea: Secondary | ICD-10-CM

## 2023-08-29 DIAGNOSIS — J3089 Other allergic rhinitis: Secondary | ICD-10-CM | POA: Diagnosis not present

## 2023-08-29 DIAGNOSIS — J393 Upper respiratory tract hypersensitivity reaction, site unspecified: Secondary | ICD-10-CM

## 2023-08-29 DIAGNOSIS — J3081 Allergic rhinitis due to animal (cat) (dog) hair and dander: Secondary | ICD-10-CM | POA: Diagnosis not present

## 2023-08-29 DIAGNOSIS — J301 Allergic rhinitis due to pollen: Secondary | ICD-10-CM | POA: Diagnosis not present

## 2023-08-29 MED ORDER — LORATADINE 10 MG PO TABS: 10.0000 mg | ORAL_TABLET | Freq: Every day | ORAL | 5 refills | Status: AC | PRN

## 2023-08-29 MED ORDER — AZELASTINE HCL 0.1 % NA SOLN: 2.0000 | Freq: Two times a day (BID) | NASAL | 5 refills | Status: AC | PRN

## 2023-08-29 NOTE — Progress Notes (Signed)
NEW PATIENT  Date of Service/Encounter:  08/29/23  Consult requested by: Marcine Matar, MD   Subjective:   Misty Cunningham (DOB: 06/01/1981) is a 43 y.o. female who presents to the clinic on 08/29/2023 with a chief complaint of Advice Only (Consult) .    History obtained from: chart review and patient.  Food Reactions: Was vegan for 7-8 years and in the last 4-5 years has reintroduced dairy ane meat.  Started having acne/rashes and took a microbiome test and then eliminated certain fruits like potatoes/squash and acne got better. Also tries to limit gluten.  Has noted vegetables/tomatoes/pineapples cause bloating, cramping, stomach pain, gum itching, nose tickling.  Has not seen GI.   Rhinitis:  Started many years ago.  Symptoms include: nasal congestion, rhinorrhea, and post nasal drainage , fatigue/lethargy Occurs seasonally-Spring Potential triggers: pollen  Treatments tried:  Flonase/Claritin in the past   Previous allergy testing: no History of sinus surgery: no Nonallergic triggers: none     Reviewed:  07/17/2023: seen in ED for eye pain/redness, believes it to be related to food allergy-gluten. Discussed possibly episcleritis. Follow up with Dr Cathey Endow.  10/17/2021: seen for food reactions, noted mucous in nose/throat with ingestion.  Also worried about gut health and done gut microbiome studies from [facility].  Started on Flonase/Claritin for post nasal drip. Referred to Allergy for food evaluation.   09/02/2016: seen in ED for generalized rash after bedbugs.  Started on benadryl and hydrocortisone PRN.   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 Has iron deficiency anemia.   Past Medical History: History reviewed. No pertinent past medical history.   Past Surgical History: Past Surgical History:  Procedure Laterality Date   WISDOM TOOTH EXTRACTION     wisdome teeth      Family History: History reviewed. No pertinent family history.  Social History:   Pets: cat   Medication List:  Allergies as of 08/29/2023   No Known Allergies      Medication List        Accurate as of August 29, 2023  3:11 PM. If you have any questions, ask your nurse or doctor.          azelastine 0.1 % nasal spray Commonly known as: ASTELIN Place 2 sprays into both nostrils 2 (two) times daily as needed. Use in each nostril as directed Started by: Birder Robson   ferrous sulfate 325 (65 FE) MG tablet Take 1 tablet (325 mg total) by mouth daily with breakfast.   fluticasone 50 MCG/ACT nasal spray Commonly known as: FLONASE Place 1 spray into both nostrils daily.   loratadine 10 MG tablet Commonly known as: CLARITIN Take 1 tablet (10 mg total) by mouth daily as needed for allergies.   prednisoLONE acetate 1 % ophthalmic suspension Commonly known as: PRED FORTE Place 1 drop into the right eye 4 (four) times daily.         REVIEW OF SYSTEMS: Pertinent positives and negatives discussed in HPI.   Objective:   Physical Exam: BP 138/82 (BP Location: Left Arm, Patient Position: Sitting, Cuff Size: Normal)   Pulse 92   Temp 98 F (36.7 C) (Temporal)   Resp 18   Ht 5\' 10"  (1.778 m)   Wt 198 lb 8 oz (90 kg)   SpO2 100%   BMI 28.48 kg/m  Body mass index is 28.48 kg/m. GEN: alert, well developed HEENT: clear conjunctiva, nose with + mild inferior turbinate hypertrophy, pink nasal mucosa, slight clear rhinorrhea HEART: regular  rate and rhythm, no murmur LUNGS: clear to auscultation bilaterally, no coughing, unlabored respiration ABDOMEN: soft, non distended  SKIN: no rashes or lesions   Skin Testing:  Skin prick testing was placed, which includes aeroallergens/foods, histamine control, and saline control.  Verbal consent was obtained prior to placing test.  Patient tolerated procedure well.  Allergy testing results were read and interpreted by myself, documented by clinical staff. Adequate positive and negative control.  Results  discussed with patient/family.  Airborne Adult Perc - 08/29/23 1420     Time Antigen Placed 1420    Allergen Manufacturer Waynette Buttery    Location Back    Number of Test 55    Panel 1 Select    2. Control-Histamine 3+    3. Bahia 3+    4. French [place] 2+    5. Johnson 3+    6. Kentucky Blue 3+    7. Meadow Fescue 3+    8. Perennial Rye 3+    9. Timothy 3+    10. Ragweed Mix 3+    11. Cocklebur 2+    12. Plantain,  English Negative    13. Baccharis Negative    14. Dog Fennel Negative    15. Russian Thistle 3+    16. Lamb's Quarters Negative    17. Sheep Sorrell Negative    18. Rough Pigweed Negative    19. Marsh Elder, Rough Negative    20. Mugwort, Common Negative    21. Box, Elder 3+    22. Cedar, red 2+    23. Sweet Gum 3+    24. Pecan Pollen 3+    25. Pine Mix Negative    26. Walnut, Black Pollen 3+    27. Red Mulberry 3+    28. Ash Mix 3+    29. Birch Mix 3+    30. Beech American 3+    31. Cottonwood, Eastern 3+    32. Hickory, White 3+    33. Maple Mix Negative    34. Oak, Guinea-[place]issau Mix 3+    35. Sycamore Eastern 3+    36. Alternaria Alternata Negative    37. Cladosporium Herbarum Negative    38. Aspergillus Mix Negative    39. Penicillium Mix Negative    40. Bipolaris Sorokiniana (Helminthosporium) Negative    41. Drechslera Spicifera (Curvularia) Negative    42. Mucor Plumbeus Negative    43. Fusarium Moniliforme Negative    44. Aureobasidium Pullulans (pullulara) Negative    45. Rhizopus Oryzae Negative    46. Botrytis Cinera Negative    47. Epicoccum Nigrum Negative    48. Phoma Betae Negative    49. Dust Mite Mix 3+    50. Cat Hair 10,000 BAU/ml 3+    51.  Dog Epithelia Negative    52. Mixed Feathers Negative    53. Horse Epithelia Negative    54. Cockroach, German Negative    55. Tobacco Leaf Negative             Food Adult Perc - 08/29/23 1400     Time Antigen Placed 1420    Allergen Manufacturer Waynette Buttery    Location Back    Number of allergen test  48    Control-Histamine 3+    1. Peanut Negative    2. Soybean Negative    3. Wheat Negative    4. Sesame Negative    5. Milk, Cow Negative    6. Casein Negative    7. Egg White, Chicken Negative  8. Shellfish Mix Negative    9. Fish Mix Negative    10. Cashew Negative    11. Walnut Food Negative    12. Almond Negative    13. Hazelnut Negative    28. Oat  Negative    29. Rice Negative    30. Barley Negative    31. Rye  Negative    32. Hops Negative    33. Mal[place] Meat Negative    34. Chicken Meat Negative    35. Pork Negative    36. Beef Negative    37. Lamb Negative    38. Tomato Negative    39. White Potato Negative    40. Sweet Potato Negative    41. Pea, Green/English Negative    42. Navy Bean Negative    43. Green Beans Negative    44. Squash Negative    45. Green Pepper Negative    46. Mushrooms Negative    47. Onion Negative    48. Avocado Negative    49. Cabbage Negative    50. Carrots 2+    51. Celery Negative    52. Corn Negative    53. Cucumber Negative    65. Pineapple Negative    66. Chocolate/Cacao Bean Negative    67. Cinnamon Negative    68. Nutmeg Negative    69. Ginger Negative    70. Garlic Negative    71. Pepper, Black Negative    72. Mustard Negative               Assessment:   1. Upper respiratory tract hypersensitivity reaction   2. Seasonal allergic rhinitis due to pollen   3. Allergic rhinitis due to dust mite   4. 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 Irritable bowel syndrome, unspecified type     Plan/Recommendations:  Food Reactions - SPT 07/2023: positive to carrots  - Discussed low suspicion for food allergies based on negative testing and symptoms of bloating/abdominal pain/gum itching/nose tickling.  Only showed mild reactivity to carrots.  - Consider seeing GI for possible irritable bowel syndrome. Will place a referral.    Allergic Rhinitis: - Due to turbinate hypertrophy, seasonal symptoms and  unresponsive to over the counter meds, will perform skin testing to identify aeroallergen triggers.   - Positive skin test 07/2023: trees, grasses, weeds, cats, dusts mites  - Avoidance measures discussed. - Use nasal saline rinses before nose sprays such as with Neilmed Sinus Rinse.  Use distilled water.   - Use Azelastine 1-2 sprays each nostril twice daily as needed for runny nose, drainage, sneezing, congestion. Aim upward and outward. - Use Claritin 10 mg daily as needed for congestion, runny nose, sneezing, itchy watery eyes.      Return in about 3 months (around 11/27/2023).  Alesia Morin, MD [facility]

## 2023-08-29 NOTE — Patient Instructions (Addendum)
Food Reactions - Discussed low suspicion for food allergies.  - Consider seeing GI for possible irritable bowel syndrome. Will place a referral.   Allergic Rhinitis: - Positive skin test 07/2023: trees, grasses, weeds, cats, dusts mites  - Avoidance measures discussed. - Use nasal saline rinses before nose sprays such as with Neilmed Sinus Rinse.  Use distilled water.   - Use Azelastine 1-2 sprays each nostril twice daily as needed for runny nose, drainage, sneezing, congestion. Aim upward and outward. - Use Claritin 10 mg daily as needed for congestion, runny nose, sneezing, itchy watery eyes.    ALLERGEN AVOIDANCE MEASURES   Dust Mites Use central air conditioning and heat; and change the filter monthly.  Pleated filters work better than mesh filters.  Electrostatic filters may also be used; wash the filter monthly.  Window air conditioners may be used, but do not clean the air as well as a central air conditioner.  Change or wash the filter monthly. Keep windows closed.  Do not use attic fans.   Encase the mattress, box springs and pillows with zippered, dust proof covers. Wash the bed linens in hot water weekly.   Remove carpet, especially from the bedroom. Remove stuffed animals, throw pillows, dust ruffles, heavy drapes and other items that collect dust from the bedroom. Do not use a humidifier.   Use wood, vinyl or leather furniture instead of cloth furniture in the bedroom. Keep the indoor humidity at 30 - 40%.   Pollen Avoidance Pollen levels are highest during the mid-day and afternoon.  Consider this when planning outdoor activities. Avoid being outside when the grass is being mowed, or wear a mask if the pollen-allergic person must be the one to mow the grass. Keep the windows closed to keep pollen outside of the home. Use an air conditioner to filter the air. Take a shower, wash hair, and change clothing after working or playing outdoors during pollen season. Pet Dander-  Cats  Keep the pet out of your bedroom and restrict it to only a few rooms. Be advised that keeping the pet in only one room will not limit the allergens to that room. Don't pet, hug or kiss the pet; if you do, wash your hands with soap and water. High-efficiency particulate air (HEPA) cleaners run continuously in a bedroom or living room can reduce allergen levels over time. Regular use of a high-efficiency vacuum cleaner or a central vacuum can reduce allergen levels. Giving your pet a bath at least once a week can reduce airborne allergen.

## 2023-11-20 ENCOUNTER — Ambulatory Visit: Payer: Medicaid Other | Admitting: Internal Medicine
